# Patient Record
Sex: Female | Born: 1975 | Race: White | Hispanic: No | Marital: Single | Smoking: Former smoker
Health system: Southern US, Community
[De-identification: ages and names within clinical notes are randomized; demographics above are authoritative.]

## PROBLEM LIST (undated history)

## (undated) DIAGNOSIS — F419 Anxiety disorder, unspecified: Secondary | ICD-10-CM

## (undated) DIAGNOSIS — I890 Lymphedema, not elsewhere classified: Secondary | ICD-10-CM

## (undated) HISTORY — DX: Lymphedema, not elsewhere classified: I89.0

## (undated) HISTORY — PX: WRIST SURGERY: SHX841

## (undated) HISTORY — DX: Anxiety disorder, unspecified: F41.9

---

## 1998-07-28 ENCOUNTER — Other Ambulatory Visit: Admission: RE | Admit: 1998-07-28 | Discharge: 1998-07-28 | Payer: Self-pay | Admitting: Gynecology

## 1999-09-10 ENCOUNTER — Other Ambulatory Visit: Admission: RE | Admit: 1999-09-10 | Discharge: 1999-09-10 | Payer: Self-pay | Admitting: Gynecology

## 2000-09-10 ENCOUNTER — Other Ambulatory Visit: Admission: RE | Admit: 2000-09-10 | Discharge: 2000-09-10 | Payer: Self-pay | Admitting: Gynecology

## 2001-02-06 ENCOUNTER — Ambulatory Visit (HOSPITAL_BASED_OUTPATIENT_CLINIC_OR_DEPARTMENT_OTHER): Admission: RE | Admit: 2001-02-06 | Discharge: 2001-02-06 | Payer: Self-pay | Admitting: Orthopedic Surgery

## 2001-09-15 ENCOUNTER — Other Ambulatory Visit: Admission: RE | Admit: 2001-09-15 | Discharge: 2001-09-15 | Payer: Self-pay | Admitting: Gynecology

## 2003-01-31 ENCOUNTER — Other Ambulatory Visit: Admission: RE | Admit: 2003-01-31 | Discharge: 2003-01-31 | Payer: Self-pay | Admitting: Gynecology

## 2003-04-07 ENCOUNTER — Encounter: Admission: RE | Admit: 2003-04-07 | Discharge: 2003-05-26 | Payer: Self-pay | Admitting: Surgery

## 2004-02-20 ENCOUNTER — Other Ambulatory Visit: Admission: RE | Admit: 2004-02-20 | Discharge: 2004-02-20 | Payer: Self-pay | Admitting: Gynecology

## 2005-01-22 ENCOUNTER — Encounter: Admission: RE | Admit: 2005-01-22 | Discharge: 2005-01-22 | Payer: Self-pay | Admitting: Gynecology

## 2005-03-11 ENCOUNTER — Other Ambulatory Visit: Admission: RE | Admit: 2005-03-11 | Discharge: 2005-03-11 | Payer: Self-pay | Admitting: Gynecology

## 2006-04-17 ENCOUNTER — Other Ambulatory Visit: Admission: RE | Admit: 2006-04-17 | Discharge: 2006-04-17 | Payer: Self-pay | Admitting: Gynecology

## 2007-04-28 ENCOUNTER — Other Ambulatory Visit: Admission: RE | Admit: 2007-04-28 | Discharge: 2007-04-28 | Payer: Self-pay | Admitting: Gynecology

## 2008-05-02 ENCOUNTER — Other Ambulatory Visit: Admission: RE | Admit: 2008-05-02 | Discharge: 2008-05-02 | Payer: Self-pay | Admitting: Gynecology

## 2010-12-28 NOTE — Op Note (Signed)
[place]  Patient:    [name], Donna Bell                      MRN: [id number] Proc. Date: 02/06/01 Attending:  Katy Fitch. Naaman Plummer., M.D.                           Operative Report  PREOPERATIVE DIAGNOSIS:  Stenosing tenosynovitis, right first dorsal compartment.  POSTOPERATIVE DIAGNOSIS:  Stenosing tenosynovitis, right first dorsal compartment.  PROCEDURE:  Release of right first dorsal compartment.  SURGEON:  Katy Fitch. Sypher, Montez Hageman., M.D.  ASSISTANT:  Jonni Sanger, P.A.  ANESTHESIA:  0.25% Marcaine and 1% lidocaine field block, supplemented by IV sedation supervised by the anesthesiologist, Kaylyn Layer. Michelle Piper, M.D.  INDICATIONS:  [name] is a 35 year old woman who has had chronic pain on the radial aspect of her right wrist.  This has not responded to splinting, activity modification, and anti-inflammatory medication.  She is brought to the operating room at this time for release of her right first dorsal compartment.  DESCRIPTION OF PROCEDURE:  [name] is brought to the operating room and placed in the supine position upon the operating table.  Following light sedation, the right arm was prepped with Betadine soap and solution and sterilely draped.  Following exsanguination of the limb with the Esmarch bandage, the arterial tourniquet was inflated to 200 mmHg.  The procedure commenced with a short incision directly over the enlarged first dorsal compartment.  Subcutaneous tissues were carefully divided, taking care to identify and gently retract the radial sensory branches.  The compartment had a fibrotic wall.  This was split with scalpel and scissors, revealing two slips of the abductor pollicis longus tendon.  There was a separate compartment for the extensor pollicis brevis noted dorsally.  This was split longitudinally, and the septum between the two compartments was resected.  Thereafter, full range of motion of  the thumb CMC and MP joints was recovered.  The wound was then repaired with intradermal 3-0 Prolene and Steri-Strips.  A compressive dressing was applied with Xeroflo, sterile gauze, and Ace wrap. There were no apparent complications.  For aftercare, [name] will remove her dressing in three days.  She will place a Band-Aid on the wound.  For aftercare, she is given a prescription for Percocet 5 mg one tablet p.o. q.4-6h. p.r.n. pain, 20 tablets without refill. She will return to our office in seven to 10 days for suture removal. DD:  02/06/01 TD:  02/06/01 Job: [id number]

## 2011-10-23 ENCOUNTER — Other Ambulatory Visit: Payer: Self-pay

## 2011-10-23 NOTE — Telephone Encounter (Signed)
Pt called and LM on nurse VM asking if we can please address RF req from pharmacy from today by tonight or tomorrow morn bc she is leaving town. I do not see a req from pharmacy yet. LMOM for pt to CB to tell us what RF she needs.

## 2011-10-23 NOTE — Telephone Encounter (Signed)
Pt called back and clarified that it is her prozac that she needs RFd, asked for 90 day again bc it is a lot cheaper for her. Told pt that we will RF for 90 days but she is actually due for f/up in April. Pt agrees to CB to make appt. Called in RF Prozac 20 QD, #90,NR

## 2011-12-25 ENCOUNTER — Telehealth: Payer: Self-pay

## 2011-12-25 NOTE — Telephone Encounter (Signed)
[facility] [address] requesting refill on pts xanex 0.5mg  Pt sees dr Merla Riches  [facility] [address] ph: [phone]  bf

## 2011-12-27 NOTE — Telephone Encounter (Signed)
Please pull paper chart.  

## 2011-12-28 MED ORDER — ALPRAZOLAM 0.5 MG PO TABS: ORAL_TABLET | ORAL | Status: DC

## 2011-12-28 NOTE — Telephone Encounter (Signed)
What is follow up plan? Patient was to have a CPE in Nov.

## 2011-12-28 NOTE — Telephone Encounter (Signed)
NOTIFIED PT THAT RX IS READY.

## 2011-12-28 NOTE — Telephone Encounter (Signed)
Done and printed

## 2011-12-28 NOTE — Telephone Encounter (Signed)
Spoke with patient, plans to [facility] Monday and schedule CPE.  Advised patient we can rx 1 month supply until she follows up.  Ok?

## 2012-02-13 ENCOUNTER — Other Ambulatory Visit: Payer: Self-pay | Admitting: Physician Assistant

## 2012-02-13 ENCOUNTER — Other Ambulatory Visit: Payer: Self-pay | Admitting: Internal Medicine

## 2012-02-13 MED ORDER — ALPRAZOLAM 0.5 MG PO TABS: ORAL_TABLET | ORAL | Status: DC

## 2012-02-13 NOTE — Telephone Encounter (Signed)
Requests refill auth for Xanax and Fluoxtine. Has appt with Merla Riches end of July. Called pharmacy for refill and they need auth from [place]. Please call patient and let her know if we can authorize.

## 2012-02-13 NOTE — Telephone Encounter (Signed)
Rx's authorized x 1, called patient and VM full.

## 2012-02-14 NOTE — Telephone Encounter (Signed)
PT notified

## 2012-02-14 NOTE — Telephone Encounter (Signed)
Looks like Herbert Seta took care of this

## 2012-02-19 ENCOUNTER — Encounter: Payer: Self-pay | Admitting: Internal Medicine

## 2012-03-11 ENCOUNTER — Ambulatory Visit (INDEPENDENT_AMBULATORY_CARE_PROVIDER_SITE_OTHER): Payer: BC Managed Care – PPO | Admitting: Internal Medicine

## 2012-03-11 ENCOUNTER — Encounter: Payer: Self-pay | Admitting: Internal Medicine

## 2012-03-11 VITALS — BP 102/68 | HR 57 | Temp 98.7°F | Resp 16 | Ht 63.0 in | Wt 125.0 lb

## 2012-03-11 DIAGNOSIS — F429 Obsessive-compulsive disorder, unspecified: Secondary | ICD-10-CM

## 2012-03-11 DIAGNOSIS — F419 Anxiety disorder, unspecified: Secondary | ICD-10-CM | POA: Insufficient documentation

## 2012-03-11 DIAGNOSIS — Z0000 Encounter for general adult medical examination without abnormal findings: Secondary | ICD-10-CM

## 2012-03-11 LAB — CBC WITH DIFFERENTIAL/PLATELET
Basophils Absolute: 0 10*3/uL (ref 0.0–0.1)
Basophils Relative: 1 % (ref 0–1)
HCT: 37.8 % (ref 36.0–46.0)
Hemoglobin: 12.6 g/dL (ref 12.0–15.0)
MCHC: 33.3 g/dL (ref 30.0–36.0)
MCV: 90.9 fL (ref 78.0–100.0)
Monocytes Absolute: 0.3 10*3/uL (ref 0.1–1.0)
Neutro Abs: 2.7 10*3/uL (ref 1.7–7.7)
RBC: 4.16 MIL/uL (ref 3.87–5.11)
RDW: 13.4 % (ref 11.5–15.5)
WBC: 4.2 10*3/uL (ref 4.0–10.5)

## 2012-03-11 LAB — COMPREHENSIVE METABOLIC PANEL
AST: 18 U/L (ref 0–37)
Albumin: 4 g/dL (ref 3.5–5.2)
Alkaline Phosphatase: 33 U/L — ABNORMAL LOW (ref 39–117)
BUN: 15 mg/dL (ref 6–23)
CO2: 25 mEq/L (ref 19–32)
Chloride: 105 mEq/L (ref 96–112)
Creat: 0.82 mg/dL (ref 0.50–1.10)
Glucose, Bld: 89 mg/dL (ref 70–99)
Sodium: 138 mEq/L (ref 135–145)
Total Protein: 6.3 g/dL (ref 6.0–8.3)

## 2012-03-11 LAB — TSH: TSH: 1.661 u[IU]/mL (ref 0.350–4.500)

## 2012-03-11 LAB — POCT URINALYSIS DIPSTICK
Bilirubin, UA: NEGATIVE
Blood, UA: NEGATIVE
Glucose, UA: NEGATIVE
Spec Grav, UA: <=1.005
Urobilinogen, UA: 0.2

## 2012-03-11 LAB — LIPID PANEL
HDL: 80 mg/dL (ref >39)
Total CHOL/HDL Ratio: 2 Ratio

## 2012-03-11 MED ORDER — ALPRAZOLAM 0.5 MG PO TABS: ORAL_TABLET | ORAL | Status: DC

## 2012-03-11 MED ORDER — FLUOXETINE HCL 20 MG PO CAPS: 20.0000 mg | ORAL_CAPSULE | Freq: Every day | ORAL | Status: DC

## 2012-03-12 ENCOUNTER — Encounter: Payer: Self-pay | Admitting: Internal Medicine

## 2012-03-14 ENCOUNTER — Encounter: Payer: Self-pay | Admitting: Internal Medicine

## 2012-03-14 NOTE — Progress Notes (Signed)
Subjective:    Patient ID: Donna Bell, female    DOB: 19-Oct-1975, 36 y.o.   MRN: [id number]  HPIannual exam Patient Active Problem List  Diagnosis  . 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 Long-term relationship going very well Active with lots of friends and enjoy life activities Anxiety controlled well enough occasional Anxiolytic medication and continued prozac  Runner- including half marathon  Review of Systems 13 system review negative    Objective:   Physical Exam Vital signs stable HEENT clear without thyromegaly or lymphadenopathy Heart is regular without murmurs rubs or gallops Lungs are clear Breasts are examined by GYN The abdomen is soft nontender nondistended without organomegaly or masses Extremities no edema and full peripheral pulses Shoulders hips knees and ankles have full range of motion Neurological intact Psychiatric contact    Results for orders placed in visit on 03/11/12  CBC WITH DIFFERENTIAL      Component Value Range   WBC 4.2  4.0 - 10.5 K/uL   RBC 4.16  3.87 - 5.11 MIL/uL   Hemoglobin 12.6  12.0 - 15.0 g/dL   HCT 96.2  95.2 - 84.1 %   MCV 90.9  78.0 - 100.0 fL   MCH 30.3  26.0 - 34.0 pg   MCHC 33.3  30.0 - 36.0 g/dL   RDW 32.4  40.1 - 02.7 %   Platelets 291  150 - 400 K/uL   Neutrophils Relative 65  43 - 77 %   Neutro Abs 2.7  1.7 - 7.7 K/uL   Lymphocytes Relative 26  12 - 46 %   Lymphs Abs 1.1  0.7 - 4.0 K/uL   Monocytes Relative 6  3 - 12 %   Monocytes Absolute 0.3  0.1 - 1.0 K/uL   Eosinophils Relative 2  0 - 5 %   Eosinophils Absolute 0.1  0.0 - 0.7 K/uL   Basophils Relative 1  0 - 1 %   Basophils Absolute 0.0  0.0 - 0.1 K/uL   Smear Review Criteria for review not met    COMPREHENSIVE METABOLIC PANEL      Component Value Range   Sodium 138  135 - 145 mEq/L   Potassium 4.3  3.5 - 5.3 mEq/L   Chloride 105  96 - 112 mEq/L   CO2 25  19 - 32 mEq/L   Glucose, Bld  89  70 - 99 mg/dL   BUN 15  6 - 23 mg/dL   Creat 2.53  6.64 - 4.03 mg/dL   Total Bilirubin 0.5  0.3 - 1.2 mg/dL   Alkaline Phosphatase 33 (*) 39 - 117 U/L   AST 18  0 - 37 U/L   ALT 17  0 - 35 U/L   Total Protein 6.3  6.0 - 8.3 g/dL   Albumin 4.0  3.5 - 5.2 g/dL   Calcium 9.1  8.4 - 47.4 mg/dL  TSH      Component Value Range   TSH 1.661  0.350 - 4.500 uIU/mL  LIPID PANEL      Component Value Range   Cholesterol 161  0 - 200 mg/dL   Triglycerides 76  <259 mg/dL   HDL 80  >56 mg/dL   Total CHOL/HDL Ratio 2.0     VLDL 15  0 - 40 mg/dL   LDL Cholesterol 66  0 - 99 mg/dL  POCT URINALYSIS DIPSTICK      Component Value Range  Color, UA yellow     Clarity, UA clear     Glucose, UA neg     Bilirubin, UA neg     Ketones, UA neg     Spec Grav, UA <=1.005     Blood, UA neg     pH, UA 5.0     Protein, UA neg     Urobilinogen, UA 0.2     Nitrite, UA neg     Leukocytes, UA Negative         Assessment & Plan:  Impression-she is a healthy annual exam and should not repeat lab work until she is 38 Problem #1 anxiety disorder/probable OCD  Meds ordered this encounter  Medications  . Multiple Vitamin (MULTIVITAMIN) tablet    Sig: Take 1 tablet by mouth daily.  Marland Kitchen etonogestrel-ethinyl estradiol (NUVARING) 0.12-0.015 MG/24HR vaginal ring    Sig: Place 1 each vaginally every 28 (twenty-eight) days. Insert vaginally and leave in place for 3 consecutive weeks, then remove for 1 week.  . ALPRAZolam (XANAX) 0.5 MG tablet    Sig: 1/2 to 1 pill daily as needed    Dispense:  30 tablet    Refill:  5  . FLUoxetine (PROZAC) 20 MG capsule    Sig: Take 1 capsule (20 mg total) by mouth daily.    Dispense:  90 capsule    Refill:  3   Followup in 6-12 months depending on medication need

## 2012-10-29 ENCOUNTER — Encounter: Payer: Self-pay | Admitting: *Deleted

## 2012-10-29 DIAGNOSIS — M25569 Pain in unspecified knee: Secondary | ICD-10-CM | POA: Insufficient documentation

## 2013-05-05 ENCOUNTER — Other Ambulatory Visit: Payer: Self-pay | Admitting: Internal Medicine

## 2013-05-16 ENCOUNTER — Ambulatory Visit (INDEPENDENT_AMBULATORY_CARE_PROVIDER_SITE_OTHER): Payer: BC Managed Care – PPO | Admitting: Internal Medicine

## 2013-05-16 VITALS — BP 100/60 | HR 62 | Temp 97.7°F | Resp 18 | Ht 64.0 in | Wt 128.0 lb

## 2013-05-16 DIAGNOSIS — F411 Generalized anxiety disorder: Secondary | ICD-10-CM

## 2013-05-16 DIAGNOSIS — F419 Anxiety disorder, unspecified: Secondary | ICD-10-CM

## 2013-05-16 DIAGNOSIS — F429 Obsessive-compulsive disorder, unspecified: Secondary | ICD-10-CM

## 2013-05-16 MED ORDER — FLUOXETINE HCL 20 MG PO CAPS: 20.0000 mg | ORAL_CAPSULE | Freq: Every day | ORAL | Status: DC

## 2013-05-16 MED ORDER — ALPRAZOLAM 0.5 MG PO TABS: ORAL_TABLET | ORAL | Status: DC

## 2013-05-16 NOTE — Progress Notes (Signed)
This chart was scribed for Donna Sia, MD by Donna Bell, ED Scribe. This patient was seen in room Room 4 and the patient's care was started at 2:45 PM  Subjective:    Patient ID: Donna Bell, female    DOB: 1976/03/02, 37 y.o.   MRN: [id number]  HPI Pt presents to [facility] with medication refill. Pt states she is staying healthy. She received her flu shot last week while at work. Pts last visit was one year ago and her last visit she was healthy. Pt states she has been on 20mg  Prozac for the past year and has been doing well. Pt does not generally take the 0.5mg  Xanax on a daily basis just PRN. Pt expressed concern of doing a yearly blood check. Pt states she has still been running and will be running a half marathon soon. Pt expressed concern of having a heart attack while running a race. She developed this thought while reading recent articles. Pt denies having family that have died before the age of 26.   Pt expressed concern of appropriate time to take Prozac. Pt was once taking it in the morning and felt "buzzed" at work. She switched to taking her medication at night and felt she was unable to sleep. Pt states she has been taking 1mg  of melatonin as a supplement. She states the melatonin has been helping with sleep. Pt is also taking Fish Oil, V-Complex, and Multi-Vitamins.   Pt feels for the last month, while at work and focusing on a certain thing, she feels her head shake. Pt states she is concerned it may be Parkinson's Disease. She denies having anybody observe this activity. Pt states she stops shaking when she moves. She expressed concern if this may be occuring only when she is tired or is it when she is really anxious.   Pt is no longer using Nuva Ring. She is now on low dose estrogen BC.(loloestrin) She states she no longer is having a NMP. She states her breasts still become tender and still gets bloated as if her menstrual cycle was going to come on but never  receives her NMP.   Current Outpatient Prescriptions on File Prior to Visit  Medication Sig Dispense Refill  . ALPRAZolam (XANAX) 0.5 MG tablet 1/2 to 1 pill daily as needed  30 tablet  5  . FLUoxetine (PROZAC) 20 MG capsule Take 1 capsule (20 mg total) by mouth daily.  90 capsule  3  . Multiple Vitamin (MULTIVITAMIN) tablet Take 1 tablet by mouth daily.      Marland Kitchen etonogestrel-ethinyl estradiol (NUVARING) 0.12-0.015 MG/24HR vaginal ring Place 1 each vaginally every 28 (twenty-eight) days. Insert vaginally and leave in place for 3 consecutive weeks, then remove for 1 week.       No current facility-administered medications on file prior to visit.   Past Medical History  Diagnosis Date  . Anxiety   rarely needs xanax Sleeping well melatonin allows fall asl p getting up to urinate  Review of Systems  Constitutional: Negative for activity change, appetite change, fatigue and unexpected weight change.  HENT: Negative for hearing loss.   Eyes: Negative for visual disturbance.  Respiratory: Negative for chest tightness and shortness of breath.   Cardiovascular: Negative for chest pain, palpitations and leg swelling.  Gastrointestinal: Negative for abdominal pain.  Endocrine: Negative for cold intolerance and heat intolerance.  Genitourinary: Negative for difficulty urinating.  Musculoskeletal:       Hx chrond. Pat.-dr graves  Neurological: Negative  for headaches.  Psychiatric/Behavioral: Negative for behavioral problems, sleep disturbance and decreased concentration. The patient is nervous/anxious.        Airplanes        Objective:   Physical Exam  Nursing note and vitals reviewed. Constitutional: She is oriented to person, place, and time. She appears well-developed and well-nourished. No distress.  HENT:  Head: Normocephalic and atraumatic.  Eyes: Conjunctivae and EOM are normal. Pupils are equal, round, and reactive to light.  Neck: Normal range of motion. Neck supple. No  thyromegaly present.  Cardiovascular: Normal rate, regular rhythm and normal heart sounds.   No murmur heard. Pulmonary/Chest: Effort normal and breath sounds normal. No respiratory distress.  Musculoskeletal: Normal range of motion. She exhibits no edema.  Lymphadenopathy:    She has no cervical adenopathy.  Neurological: She is alert and oriented to person, place, and time. She has normal reflexes. No cranial nerve deficit. She exhibits normal muscle tone. Coordination normal.  Skin: Skin is warm and dry.  Psychiatric: She has a normal mood and affect. Her behavior is normal. Judgment and thought content normal.        Assessment & Plan:  Anxiety/well controlled OCD vs GAD Contin meds Meds ordered this encounter  Medications  . ALPRAZolam (XANAX) 0.5 MG tablet----prn    Sig: 1/2 to 1 pill daily as needed    Dispense:  30 tablet    Refill:  5  . FLUoxetine (PROZAC) 20 MG capsule----5pm    Sig: Take 1 capsule (20 mg total) by mouth daily.    Dispense:  90 capsule    Refill:  3     I have reviewed and agree with documentation. Lachele Lievanos P. Merla Riches, M.D.

## 2013-11-27 ENCOUNTER — Other Ambulatory Visit: Payer: Self-pay | Admitting: Internal Medicine

## 2014-07-17 ENCOUNTER — Other Ambulatory Visit: Payer: Self-pay | Admitting: Internal Medicine

## 2014-08-26 ENCOUNTER — Other Ambulatory Visit: Payer: Self-pay | Admitting: Internal Medicine

## 2014-08-31 ENCOUNTER — Ambulatory Visit (INDEPENDENT_AMBULATORY_CARE_PROVIDER_SITE_OTHER): Payer: BC Managed Care – PPO | Admitting: Family Medicine

## 2014-08-31 ENCOUNTER — Encounter: Payer: Self-pay | Admitting: Family Medicine

## 2014-08-31 VITALS — BP 110/62 | HR 58 | Temp 98.1°F | Resp 16 | Ht 63.5 in | Wt 129.4 lb

## 2014-08-31 DIAGNOSIS — K921 Melena: Secondary | ICD-10-CM

## 2014-08-31 DIAGNOSIS — Z1329 Encounter for screening for other suspected endocrine disorder: Secondary | ICD-10-CM

## 2014-08-31 DIAGNOSIS — Z789 Other specified health status: Secondary | ICD-10-CM

## 2014-08-31 DIAGNOSIS — Z13220 Encounter for screening for lipoid disorders: Secondary | ICD-10-CM

## 2014-08-31 DIAGNOSIS — F411 Generalized anxiety disorder: Secondary | ICD-10-CM

## 2014-08-31 DIAGNOSIS — Z113 Encounter for screening for infections with a predominantly sexual mode of transmission: Secondary | ICD-10-CM

## 2014-08-31 DIAGNOSIS — Z0000 Encounter for general adult medical examination without abnormal findings: Secondary | ICD-10-CM

## 2014-08-31 DIAGNOSIS — Z131 Encounter for screening for diabetes mellitus: Secondary | ICD-10-CM

## 2014-08-31 LAB — CBC WITH DIFFERENTIAL/PLATELET
BASOS ABS: 0 10*3/uL (ref 0.0–0.1)
BASOS PCT: 0 % (ref 0–1)
Eosinophils Absolute: 0.1 10*3/uL (ref 0.0–0.7)
Eosinophils Relative: 2 % (ref 0–5)
HCT: 41.8 % (ref 36.0–46.0)
Hemoglobin: 14 g/dL (ref 12.0–15.0)
LYMPHS PCT: 30 % (ref 12–46)
Lymphs Abs: 1.5 10*3/uL (ref 0.7–4.0)
MCH: 31 pg (ref 26.0–34.0)
MCHC: 33.5 g/dL (ref 30.0–36.0)
MCV: 92.7 fL (ref 78.0–100.0)
MONO ABS: 0.3 10*3/uL (ref 0.1–1.0)
MPV: 10.9 fL (ref 8.6–12.4)
Monocytes Relative: 6 % (ref 3–12)
NEUTROS ABS: 3.1 10*3/uL (ref 1.7–7.7)
Neutrophils Relative %: 62 % (ref 43–77)
PLATELETS: 281 10*3/uL (ref 150–400)
RBC: 4.51 MIL/uL (ref 3.87–5.11)
RDW: 13 % (ref 11.5–15.5)
WBC: 5 10*3/uL (ref 4.0–10.5)

## 2014-08-31 LAB — COMPREHENSIVE METABOLIC PANEL
ALBUMIN: 4.1 g/dL (ref 3.5–5.2)
ALK PHOS: 36 U/L — AB (ref 39–117)
ALT: 28 U/L (ref 0–35)
AST: 31 U/L (ref 0–37)
BUN: 17 mg/dL (ref 6–23)
CO2: 28 mEq/L (ref 19–32)
Calcium: 9.6 mg/dL (ref 8.4–10.5)
Chloride: 106 mEq/L (ref 96–112)
Creat: 0.9 mg/dL (ref 0.50–1.10)
Glucose, Bld: 95 mg/dL (ref 70–99)
POTASSIUM: 4.7 meq/L (ref 3.5–5.3)
SODIUM: 141 meq/L (ref 135–145)
TOTAL PROTEIN: 6.8 g/dL (ref 6.0–8.3)
Total Bilirubin: 0.5 mg/dL (ref 0.2–1.2)

## 2014-08-31 LAB — TSH: TSH: 1.764 u[IU]/mL (ref 0.350–4.500)

## 2014-08-31 LAB — LIPID PANEL
CHOL/HDL RATIO: 1.9 ratio
CHOLESTEROL: 147 mg/dL (ref 0–200)
HDL: 78 mg/dL (ref >39)
LDL Cholesterol: 59 mg/dL (ref 0–99)
Triglycerides: 51 mg/dL (ref <150)
VLDL: 10 mg/dL (ref 0–40)

## 2014-08-31 LAB — VITAMIN B12: Vitamin B-12: 518 pg/mL (ref 211–911)

## 2014-08-31 LAB — IRON: IRON: 107 ug/dL (ref 42–145)

## 2014-08-31 MED ORDER — ALPRAZOLAM 0.5 MG PO TABS: ORAL_TABLET | ORAL | Status: DC

## 2014-08-31 MED ORDER — FLUOXETINE HCL 20 MG PO CAPS: 20.0000 mg | ORAL_CAPSULE | Freq: Every day | ORAL | Status: DC

## 2014-08-31 NOTE — Patient Instructions (Signed)

## 2014-08-31 NOTE — Progress Notes (Signed)
Subjective:    Patient ID: Donna Bell, female    DOB: 13-Aug-1975, 39 y.o.   MRN: [id number]  08/31/2014  Annual Exam   HPI This 39 y.o. female presents for Complete Physical Examination.  Last physical:  03/11/2012 Pap smear:  05/2014 [place]; WNL.  OCPs; no menses. Mammogram:  2015 WNL Colonoscopy: never TDAP:  Less than then years.  2007 in paper chart for dog bite.  tDAP Influenza:  05/2014 Eye exam:  05/2014; no glaucoma or cataracts; no glases orc contacts. Dental exam:  Every six months. [place].   Anxiety:  Doing well.  No concerns.  Patient reports good compliance with medication, good tolerance to medication, and good symptom control.    Anal bleeding: has noticed blood on toilet paper in the past year intermittently; there may be blood mixed in the stool but pt is not sure. Discussed with gynecology who provided pt with stool kit to complete at home; pt has not completed yet; gynecology also performed rectal exam.   Eats mostly vegan diet; eats no meat; will eat cheese and eggs.  Followed by Dr. Martinique of dermatology yearly for skin survey. Wears SPF 50 now.  Diagnosed with primary lymphedema of legs at age 39.  S/p evaluation at [facility] by multiple specialist. Wears compression stockings regularly.   Review of Systems  Constitutional: Negative for fever, chills, diaphoresis, activity change, appetite change, fatigue and unexpected weight change.  HENT: Negative for congestion, dental problem, drooling, ear discharge, ear pain, facial swelling, hearing loss, mouth sores, nosebleeds, postnasal drip, rhinorrhea, sinus pressure, sneezing, sore throat, tinnitus, trouble swallowing and voice change.   Eyes: Negative for photophobia, pain, discharge, redness, itching and visual disturbance.  Respiratory: Negative for apnea, cough, choking, chest tightness, shortness of breath, wheezing and stridor.   Cardiovascular: Positive for leg swelling. Negative for chest pain and  palpitations.  Gastrointestinal: Positive for blood in stool and anal bleeding. Negative for nausea, vomiting, abdominal pain, diarrhea, constipation, abdominal distention and rectal pain.  Endocrine: Negative for cold intolerance, heat intolerance, polydipsia, polyphagia and polyuria.  Genitourinary: Negative for dysuria, urgency, frequency, hematuria, flank pain, decreased urine volume, vaginal bleeding, vaginal discharge, enuresis, difficulty urinating, genital sores, vaginal pain, menstrual problem, pelvic pain and dyspareunia.  Musculoskeletal: Negative for myalgias, back pain, joint swelling, arthralgias, gait problem, neck pain and neck stiffness.  Skin: Negative for color change, pallor, rash and wound.  Allergic/Immunologic: Negative for environmental allergies, food allergies and immunocompromised state.  Neurological: Negative for dizziness, tremors, seizures, syncope, facial asymmetry, speech difficulty, weakness, light-headedness, numbness and headaches.  Hematological: Negative for adenopathy. Does not bruise/bleed easily.  Psychiatric/Behavioral: Negative for suicidal ideas, hallucinations, behavioral problems, confusion, sleep disturbance, self-injury, dysphoric mood, decreased concentration and agitation. The patient is not nervous/anxious and is not hyperactive.     Past Medical History  Diagnosis Date  . Anxiety   . Lymphedema of both lower extremities     onset age 49; s/p evaluation [facility]; compression stockings.   Past Surgical History  Procedure Laterality Date  . Wrist surgery     No Known Allergies Current Outpatient Prescriptions  Medication Sig Dispense Refill  . ALPRAZolam (XANAX) 0.5 MG tablet TAKE ONE-HALF TO ONE TABLET BY MOUTH DAILY AS NEEDED 30 tablet 5  . FLUoxetine (PROZAC) 20 MG capsule Take 1 capsule (20 mg total) by mouth daily. 90 capsule 11  . Multiple Vitamin (MULTIVITAMIN) tablet Take 1 tablet by mouth daily.    . LO LOESTRIN FE 1 MG-10 MCG /  10 MCG  tablet      No current facility-administered medications for this visit.   History   Social History  . Marital Status: Single    Spouse Name: N/A    Number of Children: N/A  . Years of Education: N/A   Occupational History  . Not on file.   Social History Main Topics  . Smoking status: Former Research scientist (life sciences)  . Smokeless tobacco: Not on file  . Alcohol Use: Not on file  . Drug Use: Not on file  . Sexual Activity: Not on file   Other Topics Concern  . Not on file   Social History Narrative   Marital status: single; dating seriously x 5 years; happy; no abuse      Children: none; 3 dogs, 3 cats       Lives: with boyfriend, 6 animals     Employment:  School system IT; 14 years.      Tobacco:       Alcohol:      Drugs:      Exercise: 5 days per week; personal trainer; running.  Boot camp classes      Seatbelt: 100%      Guns: none      STDs: none   Family History  Problem Relation Age of Onset  . Diabetes Father   . Mental illness Father     depression  . Cancer Maternal Grandmother   . Cancer Maternal Grandfather   . Cancer Paternal Grandmother        Objective:    BP 110/62 mmHg  Pulse 58  Temp(Src) 98.1 F (36.7 C) (Oral)  Resp 16  Ht 5' 3.5" (1.613 m)  Wt 129 lb 6.4 oz (58.695 kg)  BMI 22.56 kg/m2  SpO2 97% Physical Exam  Constitutional: She is oriented to person, place, and time. She appears well-developed and well-nourished. No distress.  HENT:  Head: Normocephalic and atraumatic.  Right Ear: External ear normal.  Left Ear: External ear normal.  Nose: Nose normal.  Mouth/Throat: Oropharynx is clear and moist.  Eyes: Conjunctivae and EOM are normal. Pupils are equal, round, and reactive to light.  Neck: Normal range of motion and full passive range of motion without pain. Neck supple. No JVD present. Carotid bruit is not present. No thyromegaly present.  Cardiovascular: Normal rate, regular rhythm and normal heart sounds.  Exam reveals no gallop and no  friction rub.   No murmur heard. Pulmonary/Chest: Effort normal and breath sounds normal. She has no wheezes. She has no rales.  Abdominal: Soft. Bowel sounds are normal. She exhibits no distension and no mass. There is no tenderness. There is no rebound and no guarding.  Musculoskeletal: She exhibits edema.       Right shoulder: Normal.       Left shoulder: Normal.       Cervical back: Normal.  +edema B feet; trace edema B legs up to proximal calf B.  Lymphadenopathy:    She has no cervical adenopathy.  Neurological: She is alert and oriented to person, place, and time. She has normal reflexes. No cranial nerve deficit. She exhibits normal muscle tone. Coordination normal.  Skin: Skin is warm and dry. No rash noted. She is not diaphoretic. No erythema. No pallor.  Diffuse sun related skin changes facial, upper chest, back, extremities.  Psychiatric: She has a normal mood and affect. Her behavior is normal. Judgment and thought content normal.  Nursing note and vitals reviewed.  Assessment & Plan:   1. Physical exam, annual   2. Generalized anxiety disorder   3. Screening for STD (sexually transmitted disease)   4. Vegetarian diet   5. Screening for diabetes mellitus   6. Screening, lipid   7. Screening for thyroid disorder   8. Bloody stools      1. Complete Physical Examination: Anticipatory guidance provided---weight maintenance, regular exercise.  Immunizations reviewed and UTD.  Pap smear UTD per gynecology; mammogram UTD per gynecology.   2.  Screening STDs: per CDC guidelines, obtain GC/Chlam, RPR, HIV. 3.  Vegetarian diet: obtain labs including vitamin B12, Vitamin d, and iron levels. 4.  Screening DMII: obtain labs. 5. Screening lipid: obtain labs. 6.  Screening thyroid: obtain labs. 7. Anxiety disorder: controlled; refill of Prozac and Xanax provided; using Xanax twice weekly on average; continue exercise for stress management. 8. Bloody stools: advised pt to  complete stool kit that was provided by gynecology; if pt notices that blood mixed in stool, will warrant colonoscopy; pt expressed understanding.    Meds ordered this encounter  Medications  . LO LOESTRIN FE 1 MG-10 MCG / 10 MCG tablet    Sig:   . FLUoxetine (PROZAC) 20 MG capsule    Sig: Take 1 capsule (20 mg total) by mouth daily.    Dispense:  90 capsule    Refill:  11  . ALPRAZolam (XANAX) 0.5 MG tablet    Sig: TAKE ONE-HALF TO ONE TABLET BY MOUTH DAILY AS NEEDED    Dispense:  30 tablet    Refill:  5    Return in about 1 year (around 09/01/2015) for complete physical examiniation.    Teion Ballin Elayne Guerin, M.D. [facility] [address] [place], [place]  [address] [phone] phone [phone] fax

## 2014-09-01 ENCOUNTER — Encounter: Payer: Self-pay | Admitting: Family Medicine

## 2014-09-01 LAB — RPR

## 2014-09-01 LAB — HEMOGLOBIN A1C
Hgb A1c MFr Bld: 5.1 % (ref <5.7)
Mean Plasma Glucose: 100 mg/dL (ref <117)

## 2014-09-01 LAB — VITAMIN D 25 HYDROXY (VIT D DEFICIENCY, FRACTURES): VIT D 25 HYDROXY: 53 ng/mL (ref 30–100)

## 2014-09-01 LAB — HIV ANTIBODY (ROUTINE TESTING W REFLEX): HIV: NONREACTIVE

## 2014-10-19 ENCOUNTER — Ambulatory Visit (INDEPENDENT_AMBULATORY_CARE_PROVIDER_SITE_OTHER): Payer: BC Managed Care – PPO | Admitting: Sports Medicine

## 2014-10-19 ENCOUNTER — Encounter: Payer: Self-pay | Admitting: Sports Medicine

## 2014-10-19 VITALS — BP 106/74 | Ht 64.0 in | Wt 129.0 lb

## 2014-10-19 DIAGNOSIS — G8929 Other chronic pain: Secondary | ICD-10-CM | POA: Diagnosis not present

## 2014-10-19 DIAGNOSIS — M25511 Pain in right shoulder: Secondary | ICD-10-CM | POA: Insufficient documentation

## 2014-10-19 DIAGNOSIS — M25561 Pain in right knee: Secondary | ICD-10-CM | POA: Diagnosis not present

## 2014-10-19 DIAGNOSIS — M7541 Impingement syndrome of right shoulder: Secondary | ICD-10-CM | POA: Diagnosis not present

## 2014-10-19 DIAGNOSIS — M25569 Pain in unspecified knee: Secondary | ICD-10-CM

## 2014-10-19 NOTE — Progress Notes (Signed)
  Donna GathersGretchen L Bell - 39 y.o. female MRN [id number]  Date of birth: 1976-04-14  SUBJECTIVE:  Including CC & ROS.  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  Previously dx with runner's knee.  Pain worse with running and going up and down stairs.  Has tried Mobic and Celebrex for pain without any relief.  No known trauma or injury.  No popping, clicking, or catching.  No effusion.  Also complains of R shoulder pain for 5 months.  She is point tender over her upper arm.  Pain aggravated by overhead movements.  No loss in strength.  No known trauma or injury.  DATA REVIEWED: No images  PHYSICAL EXAM:  VS: BP:106/74 mmHg  HR: bpm  TEMP: ( )  RESP:   HT:5\' 4"  (162.6 cm)   WT:129 lb (58.514 kg)  BMI:22.2 PHYSICAL EXAM: R knee:  Normal to inspection with no erythema or effusion or obvious bony abnormalities. Palpation normal with no warmth, joint line tenderness, patellar tenderness, or condyle tenderness. ROM full in flexion and extension and lower leg rotation. Ligaments with solid consistent endpoints including ACL, PCL, LCL, MCL. Negative Mcmurray's. Non painful patellar compression. Patellar glide without crepitus. VMO tracks below proximal portion of knee.  Normal quad strength.   Weakened hip adductor strength only on the RT On foot inspection, longitudinal arch drop mild bilaterally, Transverse arch collapse on RT with splaying of toes 1 to 5 and easy to palpate heads of 2nd and 3rd metatarsals.   Poor control with one legged squats  R shoulder: Norm ROM/ Pain with empty can test at 45 degree angle. Gives way easily/ Normal deltoid strength.  Normal supraspinatus strength. R sided scapula tracks laterally with R shoulder abduction.    ASSESSMENT & PLAN: See problem based charting & AVS for pt instructions. 1. Patellofemoral pain, chronic 2/2 poor hip abductor strength and dropped longitudinal arch - hip abductor exercises given and  rotation series with step ups - metatarsal pad inserted to assist with lift of longitudinal arch - okay with continue with normal activity  2. R shoulder pain 2/2 Impingement of supraspinatus - scapula stabilizing exercises given - rotator cuff exercises given  Reassess in 4-6 week  Osborn CohoKenzie Johnston, PGY2 [facility]  Agree with assessment   Sterling BigKB Hiliana Eilts, MD

## 2014-10-19 NOTE — Assessment & Plan Note (Signed)
-   scapula stabilizing exercises given  - rotator cuff exercises given

## 2014-10-19 NOTE — Assessment & Plan Note (Signed)
-   hip abductor exercises given - metatarsal pad inserted to assist with lift of longitudinal arch - okay with continue with normal activity

## 2014-11-24 ENCOUNTER — Telehealth: Payer: Self-pay | Admitting: *Deleted

## 2014-11-24 ENCOUNTER — Ambulatory Visit (INDEPENDENT_AMBULATORY_CARE_PROVIDER_SITE_OTHER): Payer: BC Managed Care – PPO | Admitting: Sports Medicine

## 2014-11-24 ENCOUNTER — Encounter: Payer: Self-pay | Admitting: Sports Medicine

## 2014-11-24 VITALS — BP 104/74 | Ht 64.0 in | Wt 130.0 lb

## 2014-11-24 DIAGNOSIS — M7120 Synovial cyst of popliteal space [Baker], unspecified knee: Secondary | ICD-10-CM | POA: Diagnosis not present

## 2014-11-24 DIAGNOSIS — M25511 Pain in right shoulder: Secondary | ICD-10-CM

## 2014-11-24 DIAGNOSIS — G8929 Other chronic pain: Secondary | ICD-10-CM

## 2014-11-24 DIAGNOSIS — M25552 Pain in left hip: Secondary | ICD-10-CM

## 2014-11-24 DIAGNOSIS — M25569 Pain in unspecified knee: Secondary | ICD-10-CM | POA: Diagnosis not present

## 2014-11-24 MED ORDER — CELECOXIB 200 MG PO CAPS: ORAL_CAPSULE | ORAL | Status: DC

## 2014-11-24 NOTE — Progress Notes (Signed)
Donna Bell - 39 y.o. female MRN [id number]  Date of birth: 26-May-1976  SUBJECTIVE: CC: 1. Right Shoulder Pain 2. Bilateral Knee swelling 3. Left anterior hip pain     HPI:  See problem associated subjectives     ROS:  Denies fevers, chills, night sweats. No recent weight gain or weight loss.  Negative family history of rheumatologic conditions   HISTORY:  Past Medical, Surgical, Social, and Family History reviewed & updated per EMR.  Pertinent Historical Findings include: Social History   Occupational History  . Not on file.   Social History Main Topics  . Smoking status: Former Games developer  . Smokeless tobacco: Not on file  . Alcohol Use: Not on file  . Drug Use: Not on file  . Sexual Activity: Not on file    Multiple injuries including: Chronic Right Shoulder Acute TFL Strain in April 2016 Bilateral Baker's cyst with patellofemoral  Previously worked up for RA and negative Hx of OCD and anxiety Problem  Hip Pain, Acute  Patellofemoral Pain, Chronic   2/2 poor hip abductor strength and dropped longitudinal foot arch   Right Shoulder Pain   2/2 lateral migration and protraction of scapula Course grinding with axial loading and circumduction No known injury    OBJECTIVE:  VS:   HT:5\' 4"  (162.6 cm)   WT:130 lb (58.968 kg)  BMI:22.4          BP:104/74 mmHg  HR: bpm  TEMP: ( )  RESP:   PHYSICAL EXAM: GENERAL: Adult caucasian female. No acute distress PSYCH: Alert and appropriately interactive. SKIN: No open skin lesions or abnormal skin markings on areas inspected as below VASCULAR: Bilateral radial pulses 2+/4.  No significant upper extremity edema.  NEURO: Upper extremity strength is 5+/5 in all myotomes; sensation is intact to light touch in all dermatomes. RIGHT SHOULDER EXAM: overall well aligned, no significant deformity.  Full overhead range of motion.  Rotator cuff strength is 5+/5.  Pain with speeds testing with normal strength.  Pain with axial  loading and grinding.  BILATERAL KNEE EXAM: Overall well aligned.  No significant effusion, possibly 5 mL.  Small Baker's cyst bilaterally.  Range of motion from 0 to 130 bilaterally.  Stable to varus and valgus strain, anterior, posterior drawer.  Lachman's normal.  Negative Thessaly, McMurray's.  Hip abduction strength is 5/5 with TFL predominance LEFT HIP EXAM: overall well line.  No significant swelling.  Marked tenderness palpation over the anterior lateral hip at the insertion of the TFL at the iliac crest.  No significant TTP over glute medius, glute minimus or greater trochanter.  Negative log roll, FADIR, FABER.   DATA OBTAINED:   Limited MSK Ultrasound of Bilateral Kee: Findings: Patella & Patellar Tendon: Normal Quad & Quad Tendon: Normal Suprapatellar Pouch: Normal with physiologic marginal supraphysiologic fluid Medial Joint Line: Normal Lateral Joint Line: Normal Posterior Knee: bilateral small baker's cyst ~1.5X2.5cm  Impression: The above findings are consistent with bilateral incidental baker's cysts. No marked knee pathology       Limited MSK Ultrasound of Left Anterior Hip: Findings: Small hypoechoic change at the insertion of the TFL.  increased neovascularity & positive sonopalpation.  Impression: The above findings are consistent with partial TFL avulsion       ASSESSMENT & PLAN: See problem based charting & AVS for additional documentation Problem List Items Addressed This Visit    Patellofemoral pain, chronic    Problem Based Documentation:    Subjective Report:  Long-standing bilateral knee  pain.  Reports noticing swelling earlier in the week and posterior knees, this is new.   No known specific injury, highly active  Denies fevers, chills, recent weight gain or weight loss.  No nighttime awakenings.  Previously responded well to Celebrex but does not want to be on medications long-term.  Prior rheumatologic workup that was reported as normal.  Is  not been taking any specific medications for this.  Denies any specific foot or hip pain.     Assessment & Plan & Follow up Issues:  Chronic condition  - small Baker's cysts bilaterally. Normal exam without mechanical symptomatology 1. Body Helix bilateral knees compression sleeve provided today. Discussed using during activity as well as for the first 2 hours following activity and PRN.     2. ICE 3. HEP: Hip Abduction & VMO strengthening 4. Long discussion regarding ability to continue exercising. Will limit to any pain and recommending relative rest      Relevant Medications   celecoxib (CELEBREX) 200 MG capsule   Right shoulder pain - Primary    Problem Based Documentation:    Subjective Report:  Persistent right shoulder pain.  No prior injury.  No known radicular component.  Worse with overhead motion and cross body motion.  Occasional clicking.  Difficulty lying on the side at night     Assessment & Plan & Follow up Issues:  Chronic condition  - exam findings suggestive of potential intra-articular/labral pathology. We'll try conservative treatment given other MSK issues at this time. 5. Celebrex 6. Referral to physical therapy > Consider: MSK ultrasound versus MRI arthrogram for further evaluation of intra-articular pathology.         Hip pain, acute    Problem Based Documentation:    Subjective Report:  Left anterior hip pain that is progressively worsened throughout the week.    Begin after a workout.    Pain over the anterior lateral hip.    Does not radiate her groin.    Worse with one legged stand.   Worse with sitting for prolonged periods and then going to standing.       Assessment & Plan & Follow up Issues:  Acute condition  - US shows partial TFL avulsion. Likely secondary to overuse fatigue complicated by acute musculotendinous injury 7. Eccentric exercises shown 8. Hx of Migraines so not a Nitro candidate  9. Short course of Celebrex as  this has helped her MSK issues in the past       Other Visit Diagnoses    Baker's cyst of knee, unspecified laterality           FOLLOW UP:  Return in about 6 weeks (around 01/05/2015).

## 2014-11-24 NOTE — Telephone Encounter (Signed)
Received prior auth for celebrex from [facility].  ZOXW#[id number]#[id number] valid for dates 11/03/14-11/24/15

## 2014-11-24 NOTE — Assessment & Plan Note (Addendum)
Problem Based Documentation:    Subjective Report:  Long-standing bilateral knee pain.  Reports noticing swelling earlier in the week and posterior knees, this is new.   No known specific injury, highly active  Denies fevers, chills, recent weight gain or weight loss.  No nighttime awakenings.  Previously responded well to Celebrex but does not want to be on medications long-term.  Prior rheumatologic workup that was reported as normal.  Is not been taking any specific medications for this.  Denies any specific foot or hip pain.     Assessment & Plan & Follow up Issues:  Chronic condition  - small Baker's cysts bilaterally. Normal exam without mechanical symptomatology 1. Body Helix bilateral knees compression sleeve provided today. Discussed using during activity as well as for the first 2 hours following activity and PRN.     2. ICE 3. HEP: Hip Abduction & VMO strengthening 4. Long discussion regarding ability to continue exercising. Will limit to any pain and recommending relative rest

## 2014-11-24 NOTE — Assessment & Plan Note (Addendum)
Problem Based Documentation:    Subjective Report:  Persistent right shoulder pain.  No prior injury.  No known radicular component.  Worse with overhead motion and cross body motion.  Occasional clicking.  Difficulty lying on the side at night     Assessment & Plan & Follow up Issues:  Chronic condition  - exam findings suggestive of potential intra-articular/labral pathology. We'll try conservative treatment given other MSK issues at this time. 1. Celebrex 2. Referral to physical therapy > Consider: MSK ultrasound versus MRI arthrogram for further evaluation of intra-articular pathology.

## 2014-11-28 DIAGNOSIS — M25559 Pain in unspecified hip: Secondary | ICD-10-CM | POA: Insufficient documentation

## 2014-11-28 NOTE — Assessment & Plan Note (Signed)
Problem Based Documentation:    Subjective Report:  Left anterior hip pain that is progressively worsened throughout the week.    Begin after a workout.    Pain over the anterior lateral hip.    Does not radiate her groin.    Worse with one legged stand.   Worse with sitting for prolonged periods and then going to standing.       Assessment & Plan & Follow up Issues:  Acute condition  - US shows partial TFL avulsion. Likely secondary to overuse fatigue complicated by acute musculotendinous injury 1. Eccentric exercises shown 2. Hx of Migraines so not a Nitro candidate  3. Short course of Celebrex as this has helped her MSK issues in the past

## 2014-11-28 NOTE — Procedures (Signed)
Limited MSK Ultrasound of Left Anterior Hip: Findings: Small hypoechoic change at the insertion of the TFL.  increased neovascularity & positive sonopalpation.  Impression: The above findings are consistent with partial TFL avulsion

## 2014-11-28 NOTE — Procedures (Signed)
Limited MSK Ultrasound of Bilateral Kee: Findings: Patella & Patellar Tendon: Normal Quad & Quad Tendon: Normal Suprapatellar Pouch: Normal with physiologic marginal supraphysiologic fluid Medial Joint Line: Normal Lateral Joint Line: Normal Posterior Knee: bilateral small baker's cyst ~1.5X2.5cm  Impression: The above findings are consistent with bilateral incidental baker's cysts. No marked knee pathology

## 2015-01-24 ENCOUNTER — Encounter: Payer: Self-pay | Admitting: Sports Medicine

## 2015-01-24 ENCOUNTER — Ambulatory Visit (INDEPENDENT_AMBULATORY_CARE_PROVIDER_SITE_OTHER): Payer: BC Managed Care – PPO | Admitting: Sports Medicine

## 2015-01-24 VITALS — BP 105/64 | Ht 63.0 in | Wt 127.0 lb

## 2015-01-24 DIAGNOSIS — M25552 Pain in left hip: Secondary | ICD-10-CM

## 2015-01-24 MED ORDER — NITROGLYCERIN 0.2 MG/HR TD PT24: 0.2000 mg | MEDICATED_PATCH | Freq: Every day | TRANSDERMAL | Status: DC

## 2015-01-24 NOTE — Assessment & Plan Note (Signed)
Persistent left hip pain  Seems to be a TFL insertional tear  Trial on NTG protocol  HEP with hip abduction  Reck p 6 wks and repeat scan

## 2015-01-24 NOTE — Progress Notes (Addendum)
Patient ID: Donna Bell, female   DOB: 1975-12-11, 39 y.o.   MRN: [id number]   Work done on right shoulder with O'Halloran, feeling better  Knees are doing better as well with HEP and PT  Left hip pain laterally Weak on left hip abduction before Has been doing stretches Still has this pain  Running less - once weekly However, ran 5K with no real pain  Sleeping on left is painful  Last visit US showed small avulsion/ tear at insertion of TFL Not on NTG or other Tx  Gen Muscular and NAD Lt hip full ROM Excellent flexion and adduction strength Hip abduction is 50% as strong as on RT hip TTP at upper GT on left  Repeat US This again shows the small tear at the insertion of the TFL into the upper Grt Troch Review of Glut Med and Min tendons do not show tears

## 2015-01-24 NOTE — Patient Instructions (Signed)
Nitroglycerin Protocol   Apply 1/4 nitroglycerin patch to affected area daily.  Change position of patch within the affected area every 24 hours.  You may experience a headache during the first 1-2 weeks of using the patch, these should subside.  If you experience headaches after beginning nitroglycerin patch treatment, you may take your preferred over the counter pain reliever.  Another side effect of the nitroglycerin patch is skin irritation or rash related to patch adhesive.  Please notify our office if you develop more severe headaches or rash, and stop the patch.  Tendon healing with nitroglycerin patch may require 12 to 24 weeks depending on the extent of injury.  Men should not use if taking Viagra, Cialis, or Levitra.   Do not use if you have migraines or rosacea.    See me in 6 weeks

## 2015-03-07 ENCOUNTER — Encounter: Payer: Self-pay | Admitting: Sports Medicine

## 2015-03-07 ENCOUNTER — Ambulatory Visit (INDEPENDENT_AMBULATORY_CARE_PROVIDER_SITE_OTHER): Payer: BC Managed Care – PPO | Admitting: Sports Medicine

## 2015-03-07 VITALS — BP 108/68 | HR 66 | Ht 63.0 in | Wt 127.0 lb

## 2015-03-07 DIAGNOSIS — M25531 Pain in right wrist: Secondary | ICD-10-CM

## 2015-03-07 DIAGNOSIS — M25552 Pain in left hip: Secondary | ICD-10-CM

## 2015-03-07 NOTE — Assessment & Plan Note (Signed)
Try using workout gloves to keep pressure off of volar wrist

## 2015-03-07 NOTE — Assessment & Plan Note (Signed)
Improved with NTG Both on clinical exam and [place] is much better  Encourage to be more cons, with HEP  Cont ntg for 6 to 8 wks  Reck and stop NTG at that visit if healed  OK to do all activities that do not reproduce pain

## 2015-03-07 NOTE — Progress Notes (Signed)
Patient ID: Donna Bell, female   DOB: 08-13-1975, 39 y.o.   MRN: [id number]  3 mos ago found with small TFL tear left hip Not much better on first F/U so 6 wks ago started NTG She says that it is clearly better/ no probs with NTG Less days with pain Pain is reduced more than 50%  Able to do more activity Has done home exercises but misses some days  Pain over RT carpal tunnel area Only activity with pressure is push ups No numbness or night pain   Gen Muscular NAD BP 108/68 mmHg  Pulse 66  Ht  (1.6 m)  Wt 127 lb (57.607 kg)  BMI 22.50 kg/m2    Abduction strength is now up to 75% of RT (was about 50%) Nl hip ROM Other mm groups are strong  TTP in carpal tunnel Aug phalen does not cause numbness  [place] The small tear at insertion of TFL has resolved There is still some mild hypoechoic change at area of injury Glut med tendon looks normal

## 2015-04-25 ENCOUNTER — Ambulatory Visit (INDEPENDENT_AMBULATORY_CARE_PROVIDER_SITE_OTHER): Payer: BC Managed Care – PPO | Admitting: Internal Medicine

## 2015-04-25 ENCOUNTER — Ambulatory Visit: Payer: BC Managed Care – PPO | Admitting: Sports Medicine

## 2015-04-25 VITALS — BP 114/70 | HR 66 | Temp 98.1°F | Resp 18 | Ht 63.25 in | Wt 133.6 lb

## 2015-04-25 DIAGNOSIS — L679 Hair color and hair shaft abnormality, unspecified: Secondary | ICD-10-CM | POA: Diagnosis not present

## 2015-04-25 DIAGNOSIS — R37 Sexual dysfunction, unspecified: Secondary | ICD-10-CM | POA: Diagnosis not present

## 2015-04-25 DIAGNOSIS — R5383 Other fatigue: Secondary | ICD-10-CM

## 2015-04-25 DIAGNOSIS — G478 Other sleep disorders: Secondary | ICD-10-CM | POA: Diagnosis not present

## 2015-04-25 LAB — POCT CBC
Granulocyte percent: 52.2 %G (ref 37–80)
HEMATOCRIT: 41.1 % (ref 37.7–47.9)
HEMOGLOBIN: 12.8 g/dL (ref 12.2–16.2)
LYMPH, POC: 1.7 (ref 0.6–3.4)
MCH, POC: 29 pg (ref 27–31.2)
MCHC: 31.1 g/dL — AB (ref 31.8–35.4)
MCV: 93.2 fL (ref 80–97)
MID (cbc): 0.4 (ref 0–0.9)
MPV: 7.7 fL (ref 0–99.8)
POC Granulocyte: 3.4 (ref 2–6.9)
POC LYMPH %: 31.1 % (ref 10–50)
POC MID %: 6.7 %M (ref 0–12)
Platelet Count, POC: 271 10*3/uL (ref 142–424)
RBC: 4.41 M/uL (ref 4.04–5.48)
RDW, POC: 12.7 %
WBC: 5.4 10*3/uL (ref 4.6–10.2)

## 2015-04-25 LAB — COMPREHENSIVE METABOLIC PANEL
ALBUMIN: 4.1 g/dL (ref 3.6–5.1)
ALK PHOS: 41 U/L (ref 33–115)
ALT: 22 U/L (ref 6–29)
AST: 21 U/L (ref 10–30)
BILIRUBIN TOTAL: 0.4 mg/dL (ref 0.2–1.2)
BUN: 15 mg/dL (ref 7–25)
CO2: 27 mmol/L (ref 20–31)
CREATININE: 0.74 mg/dL (ref 0.50–1.10)
Calcium: 9.2 mg/dL (ref 8.6–10.2)
Chloride: 105 mmol/L (ref 98–110)
Glucose, Bld: 92 mg/dL (ref 65–99)
Potassium: 4.5 mmol/L (ref 3.5–5.3)
SODIUM: 141 mmol/L (ref 135–146)
TOTAL PROTEIN: 6.5 g/dL (ref 6.1–8.1)

## 2015-04-25 LAB — TSH: TSH: 1.924 u[IU]/mL (ref 0.350–4.500)

## 2015-04-25 LAB — T4, FREE: Free T4: 0.96 ng/dL (ref 0.80–1.80)

## 2015-04-25 MED ORDER — ALPRAZOLAM 0.5 MG PO TABS: ORAL_TABLET | ORAL | Status: DC

## 2015-04-25 NOTE — Progress Notes (Addendum)
Subjective:  This chart was scribed for Donna Sia, MD by Stann Ore, Medical Scribe. This patient was seen in Room 1 and the patient's care was started at 2:43 PM.     Patient ID: Donna Bell, female    DOB: 17-Sep-1975, 39 y.o.   MRN: [id number] Chief Complaint  Patient presents with  . Fatigue    Pt. has been extremely tired, wants to have her iron and thyroid functions checked. x1 month   . Medication Refill    Wants a medication review    HPI Donna Bell is a 39 y.o. female who presents to [facility] complaining of feeling extremely fatigue for about a month now.  She wants to have her iron and thyroid checked. She sleeps fairly well, altho occas would wake up due to her dogs but can go back to sleep. When she wakes up, she doesn't feel energized/restored. She feels okay when she's at work. Few weeks ago, she was getting her haircut and Her hairstylist noticed that her hair has become drier.  When she sits down to read after dinner, she feels okay. There are other times during the day when she feels really sleepy. There is no difference noticed in her running. But, she felt too tired to meet up with her physical trainer the last few days.  She's also gained some weight and has always felt cold.   She denies any SOB, chest pain, palpitation. She denies feeling more stressed than usual.   She felt a little anxious a week ago while at work. No history of anxiety disorder since teen years. To calm herself, she left the office and worked out in the field. Then, she went home and took xanax. She slept and felt better. She has been on Prozac for the last several years with good control.  She notes having lost of interest in sex for the past year. She's been in her current relationship for 6 years and it's going great. She continuously takes her estrogen medication daily. Occasional deep thrust dyspareunia. Has vaginal change lubrication. Is non-orgasmic for the last year.  Alcohol or other substances not involved. No nipple discharge headaches or changes in vision. Her current GYN (Dr. Chevis Pretty) is about to retire. No past problems. Will start seeing Dr. Zelphia Cairo. She has been on continuous daily low-dose combination pill for at least 2 years with no withdrawal menses. Mother's menopause uncertain. Has never had children.  Her hip is doing better and healing up well. Dr Darrick Penna. Note CPE 1/16 doing well.  Wt Readings from Last 3 Encounters:  04/25/15 133 lb 9.6 oz (60.601 kg)  03/07/15 127 lb (57.607 kg)  01/24/15 127 lb (57.607 kg)     Patient Active Problem List   Diagnosis Date Noted  . Wrist pain, right 03/07/2015  . Hip pain, acute 11/28/2014  . Patellofemoral pain, chronic 10/19/2014  . Right shoulder pain 10/19/2014  . Knee pain 10/29/2012  . Anxiety     Current outpatient prescriptions:  .  ALPRAZolam (XANAX) 0.5 MG tablet, TAKE ONE-HALF TO ONE TABLET BY MOUTH DAILY AS NEEDED, Disp: 30 tablet, Rfl: 5 .  FLUoxetine (PROZAC) 20 MG capsule, Take 1 capsule (20 mg total) by mouth daily., Disp: 90 capsule, Rfl: 11 .  LO LOESTRIN FE 1 MG-10 MCG / 10 MCG tablet, , Disp: , Rfl:  .  Multiple Vitamin (MULTIVITAMIN) tablet, Take 1 tablet by mouth daily., Disp: , Rfl:  .  nitroGLYCERIN (MINITRAN) 0.2 mg/hr patch,  Place 1 patch (0.2 mg total) onto the skin daily., Disp: 30 patch, Rfl: 1 .  celecoxib (CELEBREX) 200 MG capsule, Take 1 tablet daily X 14 days then prn (Patient not taking: Reported on 04/25/2015), Disp: 30 capsule, Rfl: 1    Review of Systems  Constitutional: Positive for fatigue.  Respiratory: Negative for cough and shortness of breath.   Cardiovascular: Negative for chest pain and palpitations.  Gastrointestinal: Negative for nausea, vomiting, diarrhea and constipation.  Genitourinary: Negative for dysuria and frequency.  Neurological: Negative for dizziness and headaches.  Psychiatric/Behavioral: Negative for sleep disturbance.    Cold/dry hair/swell feet long term hx    Objective:   Physical Exam  Constitutional: She is oriented to person, place, and time. She appears well-developed and well-nourished. No distress.  HENT:  Head: Normocephalic and atraumatic.  Eyes: Conjunctivae and EOM are normal. Pupils are equal, round, and reactive to light.  Neck: Neck supple. No thyromegaly present.  Cardiovascular: Normal rate, regular rhythm, normal heart sounds and intact distal pulses.   No murmur heard. Pulmonary/Chest: Effort normal and breath sounds normal. No respiratory distress. She has no wheezes.  Abdominal: Soft. Bowel sounds are normal. She exhibits no distension and no mass. There is no tenderness. There is no rebound.  Musculoskeletal: Normal range of motion. She exhibits no edema.  Lymphadenopathy:    She has no cervical adenopathy.  Neurological: She is alert and oriented to person, place, and time. No cranial nerve deficit.  Skin: Skin is warm and dry.  Her hair is dry but not coarse and there is no hair loss or brittleness  Psychiatric: She has a normal mood and affect. Her behavior is normal. Judgment and thought content normal.  Nursing note and vitals reviewed.   BP 114/70 mmHg  Pulse 66  Temp(Src) 98.1 F (36.7 C) (Oral)  Resp 18  Ht 5' 3.25" (1.607 m)  Wt 133 lb 9.6 oz (60.601 kg)  BMI 23.47 kg/m2  SpO2 98%  Results for orders placed or performed in visit on 04/25/15  Comprehensive metabolic panel  Result Value Ref Range   Sodium 141 135 - 146 mmol/L   Potassium 4.5 3.5 - 5.3 mmol/L   Chloride 105 98 - 110 mmol/L   CO2 27 20 - 31 mmol/L   Glucose, Bld 92 65 - 99 mg/dL   BUN 15 7 - 25 mg/dL   Creat 4.54 0.98 - 1.19 mg/dL   Total Bilirubin 0.4 0.2 - 1.2 mg/dL   Alkaline Phosphatase 41 33 - 115 U/L   AST 21 10 - 30 U/L   ALT 22 6 - 29 U/L   Total Protein 6.5 6.1 - 8.1 g/dL   Albumin 4.1 3.6 - 5.1 g/dL   Calcium 9.2 8.6 - 14.7 mg/dL  TSH  Result Value Ref Range   TSH 1.924  0.350 - 4.500 uIU/mL  T4, free  Result Value Ref Range   Free T4 0.96 0.80 - 1.80 ng/dL  POCT CBC  Result Value Ref Range   WBC 5.4 4.6 - 10.2 K/uL   Lymph, poc 1.7 0.6 - 3.4   POC LYMPH PERCENT 31.1 10 - 50 %L   MID (cbc) 0.4 0 - 0.9   POC MID % 6.7 0 - 12 %M   POC Granulocyte 3.4 2 - 6.9   Granulocyte percent 52.2 37 - 80 %G   RBC 4.41 4.04 - 5.48 M/uL   Hemoglobin 12.8 12.2 - 16.2 g/dL   HCT, POC 82.9 56.2 -  47.9 %   MCV 93.2 80 - 97 fL   MCH, POC 29.0 27 - 31.2 pg   MCHC 31.1 (A) 31.8 - 35.4 g/dL   RDW, POC 16.1 %   Platelet Count, POC 271 142 - 424 K/uL   MPV 7.7 0 - 99.8 fL        Assessment & Plan:  Fatigue Unrefreshed by sleep Hair changes Sexual dysfunction  --With normal labs we will first discontinue Prozac over the next 3 weeks --She is [facility] --If there is no change after discontinuing Prozac we will discontinue oral contraception is for short while --If develops hair loss [facility] --She is asked to focus on her sleep cycle more specifically and have her partner see if she has apnea or restlessness  I have completed the patient encounter in its entirety as documented by the scribe, with editing by me where necessary. Hermilo Dutter P. Merla Riches, M.D.

## 2015-04-26 ENCOUNTER — Telehealth: Payer: Self-pay | Admitting: Internal Medicine

## 2015-04-26 MED ORDER — FLUOXETINE HCL 10 MG PO CAPS: ORAL_CAPSULE | ORAL | Status: DC

## 2015-04-26 NOTE — Telephone Encounter (Addendum)
Labs wnl so wean prozac--watch for anxiety emerging If no chg in 1-2  mo will d/c ocps

## 2015-05-30 ENCOUNTER — Ambulatory Visit: Payer: BC Managed Care – PPO | Admitting: Sports Medicine

## 2015-07-10 ENCOUNTER — Encounter: Payer: Self-pay | Admitting: Internal Medicine

## 2015-07-18 ENCOUNTER — Other Ambulatory Visit: Payer: Self-pay | Admitting: Internal Medicine

## 2015-07-20 ENCOUNTER — Telehealth: Payer: Self-pay

## 2015-07-20 MED ORDER — FLUOXETINE HCL 10 MG PO CAPS: 10.0000 mg | ORAL_CAPSULE | Freq: Every day | ORAL | Status: DC

## 2015-07-20 NOTE — Telephone Encounter (Signed)
Dr. Merla Richesoolittle,  I spoke with this pt. And she wanted to know if she wanted to get a refill of her Prozac. I see you gave her a rx back and Sept. But have not seen her since. She had said even though she had gotten that rx for the 10mg  she still had some of the 20mg  left, so she finished up taking the 20 and started on the 10mg .   Please advise

## 2015-07-20 NOTE — Telephone Encounter (Signed)
Pt called in stating that her refill for( Prozac 10mg ) was declined and she wants to know the reason why. Please call back with the information please.

## 2015-07-20 NOTE — Telephone Encounter (Signed)
Did she decide not to stop it? i sent in refills

## 2015-10-13 ENCOUNTER — Other Ambulatory Visit: Payer: Self-pay | Admitting: *Deleted

## 2015-10-13 MED ORDER — CELECOXIB 200 MG PO CAPS: ORAL_CAPSULE | ORAL | Status: DC

## 2015-11-15 ENCOUNTER — Other Ambulatory Visit: Payer: Self-pay | Admitting: Internal Medicine

## 2015-11-16 ENCOUNTER — Telehealth: Payer: Self-pay

## 2015-11-16 NOTE — Telephone Encounter (Signed)
Pt would like a 90 day supply of her. It is cheaper for her to get the 90/day supply rather than the 30/day 3 refill from her pharmacy. Please advise at [phone] FLUoxetine (PROZAC) 10 MG capsule [Pharmacy Med Name: FLUOXETINE HCL 10 MG CAPSULE] 30 capsule 3 11/15/2015

## 2015-11-17 NOTE — Telephone Encounter (Signed)
90 days was sent in today.

## 2015-11-17 NOTE — Telephone Encounter (Signed)
Okayed per Deliah BostonMichael Clark.  Needs to return for office visit before running out.

## 2015-11-27 ENCOUNTER — Ambulatory Visit (INDEPENDENT_AMBULATORY_CARE_PROVIDER_SITE_OTHER): Payer: BC Managed Care – PPO | Admitting: Internal Medicine

## 2015-11-27 VITALS — BP 104/62 | HR 80 | Temp 98.8°F | Resp 15 | Ht 63.25 in | Wt 132.8 lb

## 2015-11-27 DIAGNOSIS — J0100 Acute maxillary sinusitis, unspecified: Secondary | ICD-10-CM

## 2015-11-27 DIAGNOSIS — J029 Acute pharyngitis, unspecified: Secondary | ICD-10-CM | POA: Diagnosis not present

## 2015-11-27 DIAGNOSIS — F419 Anxiety disorder, unspecified: Secondary | ICD-10-CM | POA: Diagnosis not present

## 2015-11-27 DIAGNOSIS — R37 Sexual dysfunction, unspecified: Secondary | ICD-10-CM | POA: Diagnosis not present

## 2015-11-27 MED ORDER — ALPRAZOLAM 0.5 MG PO TABS: ORAL_TABLET | ORAL | Status: DC

## 2015-11-27 MED ORDER — LIDOCAINE VISCOUS 2 % MT SOLN: OROMUCOSAL | Status: DC

## 2015-11-27 MED ORDER — AZITHROMYCIN 250 MG PO TABS: ORAL_TABLET | ORAL | Status: DC

## 2015-11-27 MED ORDER — ETONOGESTREL-ETHINYL ESTRADIOL 0.12-0.015 MG/24HR VA RING: VAGINAL_RING | VAGINAL | Status: DC

## 2015-11-27 MED ORDER — FLUOXETINE HCL 10 MG PO CAPS: ORAL_CAPSULE | ORAL | Status: DC

## 2015-11-27 NOTE — Patient Instructions (Signed)
     IF you received an x-ray today, you will receive an invoice from [facility]. Please contact [facility] at [phone] with questions or concerns regarding your invoice.   IF you received labwork today, you will receive an invoice from [facility]. Please contact [facility] at [phone] with questions or concerns regarding your invoice.   Our billing staff will not be able to assist you with questions regarding bills from these companies.  You will be contacted with the lab results as soon as they are available. The fastest way to get your results is to activate your My Chart account. Instructions are located on the last page of this paperwork. If you have not heard from us regarding the results in 2 weeks, please contact this office.      

## 2015-11-27 NOTE — Progress Notes (Signed)
Subjective:  By signing my name below, I, Raven Small, attest that this documentation has been prepared under the direction and in the presence of [name], MD.  Electronically Signed: Andrew Au, ED Scribe. 11/27/2015. 10:27 AM.   Patient ID: Donna Bell, female    DOB: 1975/09/05, 40 y.o.   MRN: [id number]  HPI   Chief Complaint  Patient presents with  . Sore Throat    x 6 days   . Nasal Congestion    pressure in head, concern with sinus infection. [place], started on Amoxicillin, but broke out in a rash     HPI Comments: Donna Bell is a 40 y.o. female who presents to the [facility] complaining of sore throat that began 6 days ago. Pt states she was seen at the. She reports associated HA, body aches, cough, nasal congestion with thick yellow mucous. She was taking alka seltzer cold but did not find relief with this. She was seen while at the beach last week and was treated with amoxicillin but broke out in a non-painful, non-itchy erythematous rash along her trunk. She has been taking OTC sinus congestion and pain relief medication without relief. She denies fever, night sweats and chills.   Pt would like a medication refill. She takes xanax as needed to help with sleep but finds herself using medication. She is tolerating prozac  well.  Pt takes celebrex as needed when she has a race.  Nitroglycerin patch worked well for her.   Patient Active Problem List   Diagnosis Date Noted  . Wrist pain, right 03/07/2015  . Hip pain, acute 11/28/2014  . Patellofemoral pain, chronic 10/19/2014  . Right shoulder pain 10/19/2014  . Knee pain 10/29/2012  . Anxiety    Past Medical History  Diagnosis Date  . Anxiety   . Lymphedema of both lower extremities     onset age 2; s/p evaluation [facility]; compression stockings.   Past Surgical History  Procedure Laterality Date  . Wrist surgery     Allergies  Allergen  Reactions  . Amoxicillin Rash   Prior to Admission medications   Medication Sig Start Date End Date Taking? Authorizing Provider  ALPRAZolam Prudy Feeler) 0.5 MG tablet TAKE ONE-HALF TO ONE TABLET BY MOUTH DAILY AS NEEDED 04/25/15  Yes Tonye Pearson, MD  celecoxib (CELEBREX) 200 MG capsule Take 1 tablet daily X 14 days then prn 10/13/15  Yes Enid Baas, MD  FLUoxetine (PROZAC) 10 MG capsule TAKE 1 CAPSULE (10 MG TOTAL) BY MOUTH DAILY. 11/17/15  Yes Ofilia Neas, PA-C  LO LOESTRIN FE 1 MG-10 MCG / 10 MCG tablet  08/07/14  Yes Historical Provider, MD  Multiple Vitamin (MULTIVITAMIN) tablet Take 1 tablet by mouth daily.   Yes Historical Provider, MD  nitroGLYCERIN (MINITRAN) 0.2 mg/hr patch Place 1 patch (0.2 mg total) onto the skin daily. 01/24/15  Yes Enid Baas, MD   Social History   Social History  . Marital Status: Single    Spouse Name: N/A  . Number of Children: N/A  . Years of Education: N/A   Occupational History  . Not on file.   Social History Main Topics  . Smoking status: Former Games developer  . Smokeless tobacco: Not on file  . Alcohol Use: Not on file  . Drug Use: Not on file  . Sexual Activity: Not on file   Other Topics Concern  . Not on file  Social History Narrative   Marital status: single; dating seriously x 5 years; happy; no abuse      Children: none; 3 dogs, 3 cats       Lives: with boyfriend, 6 animals     Employment:  School system IT; 14 years.      Tobacco:       Alcohol:      Drugs:      Exercise: 5 days per week; personal trainer; running.  Boot camp classes      Seatbelt: 100%      Guns: none      STDs: none   Review of Systems  Constitutional: Negative for fever, chills and diaphoresis.  HENT: Positive for congestion, postnasal drip, sinus pressure and sore throat.   Respiratory: Positive for cough.   Genitourinary:       See 9/16 OV--gyn f/u was not helpful ?ocps the cause of reduced libido Remembers she did well with nuvaring    Musculoskeletal: Positive for myalgias.  Neurological: Positive for headaches.   Objective:  Physical Exam  Constitutional: She is oriented to person, place, and time. She appears well-developed and well-nourished. No distress.  HENT:  Head: Normocephalic and atraumatic.  Mouth/Throat: Posterior oropharyngeal erythema present. No oropharyngeal exudate.  Purulent discharge in nose.  Eyes: Conjunctivae and EOM are normal.  Neck: Neck supple.  Small tender cervical nodes  Cardiovascular: Normal rate, regular rhythm and normal heart sounds.   No murmur heard. Pulmonary/Chest: Effort normal. She has no wheezes. She has no rales.  Musculoskeletal: Normal range of motion.  Neurological: She is alert and oriented to person, place, and time.  Skin: Skin is warm and dry.  Psychiatric: She has a normal mood and affect. Her behavior is normal.  Nursing note and vitals reviewed.  Filed Vitals:   11/27/15 0953  BP: 104/62  Pulse: 80  Temp: 98.8 F (37.1 C)  TempSrc: Oral  Resp: 15  Height: 5' 3.25" (1.607 m)  Weight: 132 lb 12.8 oz (60.238 kg)  SpO2: 99%   Assessment & Plan:   1 Acute maxillary sinusitis, recurrence not specified   ----chg to zith 2 Sore throat ---TC sent  3 Anxiety ---refill proz and xanax---only has to use xanax occas to sleep now  4 Sexual dysfunction --try chg to nuva ring///read [employer]        Orders Placed This Encounter  Procedures  . Culture, Group A Strep    Order Specific Question:  Source    Answer:  throat    Meds ordered this encounter  Medications  . FLUoxetine (PROZAC) 10 MG capsule    Sig: TAKE 1 CAPSULE (10 MG TOTAL) BY MOUTH DAILY.    Dispense:  90 capsule    Refill:  3  . ALPRAZolam (XANAX) 0.5 MG tablet    Sig: TAKE ONE-HALF TO ONE TABLET BY MOUTH DAILY AS NEEDED    Dispense:  30 tablet    Refill:  5  . azithromycin (ZITHROMAX) 250 MG tablet    Sig: As packaged    Dispense:  6 tablet    Refill:  0  . lidocaine  (XYLOCAINE) 2 % solution    Sig: Use 1 teaspoon every 2 hours to swish and swallow or spit as needed for pain    Dispense:  60 mL    Refill:  0  . etonogestrel-ethinyl estradiol (NUVARING) 0.12-0.015 MG/24HR vaginal ring    Sig: Insert vaginally and leave in place for 3 consecutive weeks, then  remove for 1 week.    Dispense:  1 each    Refill:  12  call if no resp to zith Consider f/u with Mannie Stabile PA-C in 3-6 mo I have completed the patient encounter in its entirety as documented by the scribe, with editing by me where necessary. Rhiannon Sassaman P. Merla Riches, M.D.

## 2015-11-28 LAB — CULTURE, GROUP A STREP: ORGANISM ID, BACTERIA: NORMAL

## 2016-02-11 ENCOUNTER — Other Ambulatory Visit: Payer: Self-pay | Admitting: Physician Assistant

## 2016-02-14 ENCOUNTER — Telehealth: Payer: Self-pay

## 2016-02-14 ENCOUNTER — Other Ambulatory Visit: Payer: Self-pay | Admitting: Physician Assistant

## 2016-02-14 NOTE — Telephone Encounter (Signed)
Left message for pt to call back  °

## 2016-02-14 NOTE — Telephone Encounter (Signed)
Pt is needing to talk with someone about her prozac rx not being approved and she will be out in 3 days and going out of town this weekend  Best number [phone]

## 2016-02-16 NOTE — Telephone Encounter (Signed)
This is taken care of, the pharmacy has her refills.

## 2016-12-11 NOTE — Progress Notes (Signed)
[facility] [address] [place], [place] [address] Phone: [phone] Subjective:    I'm seeing this patient by the request  of:    CC: Left elbow pain right knee pain   BJY:[id number]  Donna Bell is a 41 y.o. female coming in with complaint of left elbow pain. Patient has had this for quite some time. Discuss as a dull, throbbing aching pain. States it's worse with certain lifting mechanics that she has not finger out yet. Patient continues to try to work on a regular basis but finds it difficult secondary to the discomfort. Patient denies any numbness. Patient does normal range her injury but continues to work out regularly. Rates the severity of pain a 6 out of 10 and sometimes can have a throbbing aching pain at night.   also right knee pain. Patient has pain on the anterior aspect of the knee. Seems to be worse with running or jumping. Symptoms going down stairs. Sometimes feels like she has swelling. Has been diagnosed with a Baker cyst previously. Rates severity pain as 5 out of 10. Denies any locking or giving out on her. Better with rest.  Past Medical History:  Diagnosis Date  . Anxiety   . Lymphedema of both lower extremities    onset age 61; s/p evaluation [facility]; compression stockings.   Past Surgical History:  Procedure Laterality Date  . WRIST SURGERY     Social History   Social History  . Marital status: Single    Spouse name: N/A  . Number of children: N/A  . Years of education: N/A   Social History Main Topics  . Smoking status: Former Games developer  . Smokeless tobacco: Not on file  . Alcohol use Not on file  . Drug use: Unknown  . Sexual activity: Not on file   Other Topics Concern  . Not on file   Social History Narrative   Marital status: single; dating seriously x 5 years; happy; no abuse      Children: none; 3 dogs, 3 cats       Lives: with boyfriend, 6 animals     Employment:  School system IT; 14 years.   Tobacco:       Alcohol:      Drugs:      Exercise: 5 days per week; personal trainer; running.  Boot camp classes      Seatbelt: 100%      Guns: none      STDs: none   Allergies  Allergen Reactions  . Amoxicillin Rash   Family History  Problem Relation Age of Onset  . Diabetes Father   . Mental illness Father     depression  . Cancer Maternal Grandmother   . Cancer Maternal Grandfather   . Cancer Paternal Grandmother     Past medical history, social, surgical and family history all reviewed in electronic medical record.  No pertanent information unless stated regarding to the chief complaint.   Review of Systems:Review of systems updated and as accurate as of 12/12/16  No headache, visual changes, nausea, vomiting, diarrhea, constipation, dizziness, abdominal pain, skin rash, fevers, chills, night sweats, weight loss, swollen lymph nodes, body aches, joint swelling, muscle aches, chest pain, shortness of breath, mood changes.   Objective  Blood pressure 110/66, pulse 76, resp. rate 16, weight 130 lb 4 oz (59.1 kg), SpO2 98 %. Systems examined below as of 12/12/16   General: No apparent distress alert and oriented x3  mood and affect normal, dressed appropriately.  HEENT: Pupils equal, extraocular movements intact  Respiratory: Patient's speak in full sentences and does not appear short of breath  Cardiovascular: No lower extremity edema, non tender, no erythema  Skin: Warm dry intact with no signs of infection or rash on extremities or on axial skeleton.  Abdomen: Soft nontender  Neuro: Cranial nerves II through XII are intact, neurovascularly intact in all extremities with 2+ DTRs and 2+ pulses.  Lymph: No lymphadenopathy of posterior or anterior cervical chain or axillae bilaterally.  Gait normal with good balance and coordination.  MSK:  Non tender with full range of motion and good stability and symmetric strength and tone of shoulders,  wrist, hip and ankles bilaterally.    Elbow: Left Unremarkable to inspection. Range of motion full pronation, supination, flexion, extension. 4-5 strength compared to  contralateral side Stable to varus, valgus stress. Negative moving valgus stress test. Tender to palpation in the lateral epicondylar region Ulnar nerve does not sublux. Negative cubital tunnel Tinel's. Contralateral elbow unremarkable  Musculoskeletal ultrasound was performed and interpreted by Terrilee Files D.O.   Elbow:  Lateral epicondylar region does have some very mild hypoechoic changes but no true tear. No true avulsion fracture noted.  IMPRESSION:  Lateral epicondylitis  Procedure note 97110; 15 minutes spent for Therapeutic exercises as stated in above notes.  This included exercises focusing on stretching, strengthening, with significant focus on eccentric aspects.   Patellofemoral Syndrome  Reviewed anatomy using anatomical model and how PFS occurs.  Given rehab exercises handout for VMO, hip abductors, core, entire kinetic chain including proprioception exercises including cone touches, step downs, hip elevations and turn outs.  Could benefit from PT, regular exercise, upright biking, and a PFS knee brace to assist with tracking abnormalities. Proper technique shown and discussed handout in great detail with ATC.  All questions were discussed and answered.      Impression and Recommendations:     This case required medical decision making of moderate complexity.      Note: This dictation was prepared with Dragon dictation along with smaller phrase technology. Any transcriptional errors that result from this process are unintentional.

## 2016-12-12 ENCOUNTER — Ambulatory Visit (INDEPENDENT_AMBULATORY_CARE_PROVIDER_SITE_OTHER): Payer: BC Managed Care – PPO | Admitting: Family Medicine

## 2016-12-12 ENCOUNTER — Ambulatory Visit: Payer: Self-pay

## 2016-12-12 VITALS — BP 110/66 | HR 76 | Resp 16 | Wt 130.2 lb

## 2016-12-12 DIAGNOSIS — M7712 Lateral epicondylitis, left elbow: Secondary | ICD-10-CM

## 2016-12-12 DIAGNOSIS — M25569 Pain in unspecified knee: Secondary | ICD-10-CM | POA: Diagnosis not present

## 2016-12-12 DIAGNOSIS — M25522 Pain in left elbow: Secondary | ICD-10-CM | POA: Diagnosis not present

## 2016-12-12 DIAGNOSIS — G8929 Other chronic pain: Secondary | ICD-10-CM | POA: Diagnosis not present

## 2016-12-12 MED ORDER — DICLOFENAC SODIUM 2 % TD SOLN: 2.0000 g | Freq: Two times a day (BID) | TRANSDERMAL | 3 refills | Status: DC

## 2016-12-12 NOTE — Assessment & Plan Note (Signed)
Patient will do home exercises, icing regimen, patient given a wrist sprain is to avoid wrist extension. We discussed ergonomics throughout work. Patient will start make any changes and come back and see me again in 4-6 weeks.

## 2016-12-12 NOTE — Assessment & Plan Note (Signed)
Patient given home exercises and will do topical anti-inflammatories. Patient is going to potentially even will avoid certain activities. We discussed the importance of vastus medialis obliques strengthening. Work with Event organiserathletic trainer to learn home exercises in greater detail.

## 2016-12-12 NOTE — Patient Instructions (Signed)
Good to see you.   Ice 20 minutes 2 times daily. Usually after activity and before bed. Wear brace day and night for 1 week then nightly for 2 weeks Only lift with thumbs up or underhand.  pennsaid pinkie amount topically 2 times daily as needed.  Exercises 3 times a week.  For the knee more bike and elliptical  Would lay off running or jumping for now.  pennsaid pinkie amount topically 2 times daily as needed.  Vitamin D 2000 IU daily  Turmeric 500mg  twice daily  You will do great  See me again in 4 weeks.

## 2016-12-12 NOTE — Progress Notes (Signed)
Pre-visit discussion using our clinic review tool. No additional management support is needed unless otherwise documented below in the visit note.  

## 2016-12-16 ENCOUNTER — Telehealth: Payer: Self-pay | Admitting: General Practice

## 2016-12-16 ENCOUNTER — Telehealth: Payer: Self-pay | Admitting: Family Medicine

## 2016-12-16 ENCOUNTER — Other Ambulatory Visit: Payer: Self-pay

## 2016-12-16 DIAGNOSIS — M25522 Pain in left elbow: Secondary | ICD-10-CM

## 2016-12-16 MED ORDER — DICLOFENAC SODIUM 2 % TD SOLN: 2.0000 g | Freq: Two times a day (BID) | TRANSDERMAL | 3 refills | Status: DC

## 2016-12-16 NOTE — Telephone Encounter (Signed)
States accidentally denied pennsaid script.  Needs script to be sent over again.

## 2016-12-16 NOTE — Telephone Encounter (Signed)
Sent patient MyChart message with pharmacy phone number. Medication is not called in locally but to a pharmacy in [place].

## 2016-12-16 NOTE — Telephone Encounter (Signed)
Pt called in and said that that she never rec'd the pensaid.  She said that she called the pharmacy and they do not have it.  Can this be resent?

## 2016-12-17 ENCOUNTER — Telehealth: Payer: Self-pay | Admitting: General Practice

## 2016-12-17 ENCOUNTER — Other Ambulatory Visit: Payer: Self-pay

## 2016-12-17 DIAGNOSIS — M25522 Pain in left elbow: Secondary | ICD-10-CM

## 2016-12-17 MED ORDER — DICLOFENAC SODIUM 2 % TD SOLN: 2.0000 g | Freq: Two times a day (BID) | TRANSDERMAL | 3 refills | Status: DC

## 2016-12-17 NOTE — Telephone Encounter (Signed)
Please call pt back, has questions about appt 5/3

## 2016-12-17 NOTE — Telephone Encounter (Signed)
Patient called in as she was considering making a work comp claim for her elbow injury. Was curious if we filed work comp. Let her know that we do not and that she would have to see her primary care doctor or the doc that does work comp for her work in order to get paperwork filled out. Patient voices understanding.

## 2017-01-09 ENCOUNTER — Ambulatory Visit: Payer: BC Managed Care – PPO | Admitting: Family Medicine

## 2017-02-27 ENCOUNTER — Telehealth: Payer: Self-pay | Admitting: Physician Assistant

## 2017-02-27 NOTE — Telephone Encounter (Signed)
PATIENT IS REQUESTING A REFILL ON HER FLUOXETINE HCI 10 MG. SHE SAID SHE IS COMPLETELY OUT AND CAN NOT JUST STOP TAKING IT. HER PHARMACIST TOLD HER THEY FAXED OVER A REFILL REQUEST ON WED. (02/26/17). (I EXPLAINED OUR REFILL POLICY). BEST PHONE [phone] (CELL) PHARMACY CHOICE IS [facility] [address].

## 2017-02-27 NOTE — Telephone Encounter (Signed)
Pt advised she will need to schedule an office visit before refills. Last seen in the clinic 1 year ago Transferred to schedule line

## 2017-02-28 ENCOUNTER — Ambulatory Visit (INDEPENDENT_AMBULATORY_CARE_PROVIDER_SITE_OTHER): Payer: BC Managed Care – PPO | Admitting: Physician Assistant

## 2017-02-28 ENCOUNTER — Encounter: Payer: Self-pay | Admitting: Physician Assistant

## 2017-02-28 VITALS — BP 107/67 | HR 64 | Temp 98.1°F | Resp 18 | Ht 63.25 in | Wt 131.6 lb

## 2017-02-28 DIAGNOSIS — F411 Generalized anxiety disorder: Secondary | ICD-10-CM | POA: Diagnosis not present

## 2017-02-28 MED ORDER — ALPRAZOLAM 0.5 MG PO TABS: ORAL_TABLET | ORAL | 1 refills | Status: AC

## 2017-02-28 MED ORDER — FLUOXETINE HCL 10 MG PO CAPS: 10.0000 mg | ORAL_CAPSULE | Freq: Every day | ORAL | 0 refills | Status: DC

## 2017-02-28 NOTE — Patient Instructions (Signed)
     IF you received an x-ray today, you will receive an invoice from [facility]. Please contact [facility] at [phone] with questions or concerns regarding your invoice.   IF you received labwork today, you will receive an invoice from [facility]. Please contact [facility] at [phone] with questions or concerns regarding your invoice.   Our billing staff will not be able to assist you with questions regarding bills from these companies.  You will be contacted with the lab results as soon as they are available. The fastest way to get your results is to activate your My Chart account. Instructions are located on the last page of this paperwork. If you have not heard from us regarding the results in 2 weeks, please contact this office.     

## 2017-02-28 NOTE — Progress Notes (Signed)
02/28/2017 10:03 AM   DOB: [birth year] / MRN: [id number]  SUBJECTIVE:  Donna GathersGretchen L Bell is a 41 y.o. female presenting for medication refills.  Tells me Prozac has helped with her anxiety and obsessive tendencies (stove checking and door checking). She tells me that she has been told that she has GAD.  She feels well overall and has not been on her prozac now for about 1 weeks.  Notes no change in her symptoms.   Tells me that she does not take xanax daily and sometimes not weekly.    Depression screen PHQ 2/9 11/27/2015  Decreased Interest 0  Down, Depressed, Hopeless 0  PHQ - 2 Score 0     She is allergic to amoxicillin.   She  has a past medical history of Anxiety and Lymphedema of both lower extremities.    She  reports that she has quit smoking. She has never used smokeless tobacco. She  has no sexual activity history on file. The patient  has a past surgical history that includes Wrist surgery.  Her family history includes Cancer in her maternal grandfather, maternal grandmother, and paternal grandmother; Diabetes in her father; Mental illness in her father.  Review of Systems  Psychiatric/Behavioral: Negative for depression, hallucinations, memory loss, substance abuse and suicidal ideas. The patient is nervous/anxious. The patient does not have insomnia.     The problem list and medications were reviewed and updated by myself where necessary and exist elsewhere in the encounter.   OBJECTIVE:  BP 107/67   Pulse 64   Temp 98.1 F (36.7 C) (Oral)   Resp 18   Ht 5' 3.25" (1.607 m)   Wt 131 lb 9.6 oz (59.7 kg)   LMP 02/11/2017   SpO2 97%   BMI 23.13 kg/m   Physical Exam  Constitutional: She is oriented to person, place, and time. She is active.  Non-toxic appearance.  Eyes: Pupils are equal, round, and reactive to light. EOM are normal.  Cardiovascular: Normal rate, regular rhythm, S1 normal, S2 normal, normal heart sounds and intact distal pulses.  Exam reveals no  gallop, no friction rub and no decreased pulses.   No murmur heard. Pulmonary/Chest: Effort normal. No stridor. No tachypnea. No respiratory distress. She has no wheezes. She has no rales.  Abdominal: She exhibits no distension.  Musculoskeletal: She exhibits no edema.  Neurological: She is alert and oriented to person, place, and time. She has normal strength and normal reflexes. She is not disoriented. She displays no atrophy. No cranial nerve deficit or sensory deficit. She exhibits normal muscle tone. Coordination and gait normal.  Skin: Skin is warm and dry. She is not diaphoretic. No pallor.  Psychiatric: Her behavior is normal.    No results found for this or any previous visit (from the past 72 hour(s)).  No results found.  ASSESSMENT AND PLAN:  [name] was seen today for medication refill.  Diagnoses and all orders for this visit:  GAD (generalized anxiety disorder): Controlled. Will continue current plan.  Using xanax responsibly per HPI.  I am happy to carry this for her.   -     FLUoxetine (PROZAC) 10 MG capsule; Take 1 capsule (10 mg total) by mouth daily. -     ALPRAZolam (XANAX) 0.5 MG tablet; TAKE ONE-HALF TO ONE TABLET BY MOUTH DAILY AS NEEDED    The patient is advised to call or return to clinic if she does not see an improvement in symptoms, or to seek the  care of the closest emergency department if she worsens with the above plan.   Deliah Boston, MHS, PA-C [facility] at [facility] 02/28/2017 10:03 AM

## 2017-07-24 ENCOUNTER — Ambulatory Visit: Payer: BC Managed Care – PPO | Admitting: Sports Medicine

## 2017-07-24 ENCOUNTER — Encounter: Payer: Self-pay | Admitting: Sports Medicine

## 2017-07-24 DIAGNOSIS — G2589 Other specified extrapyramidal and movement disorders: Secondary | ICD-10-CM

## 2017-07-24 DIAGNOSIS — M2241 Chondromalacia patellae, right knee: Secondary | ICD-10-CM | POA: Diagnosis not present

## 2017-07-24 MED ORDER — GABAPENTIN 300 MG PO CAPS: 300.0000 mg | ORAL_CAPSULE | Freq: Every day | ORAL | 1 refills | Status: DC

## 2017-07-24 NOTE — Assessment & Plan Note (Signed)
Patient is showing some evidence of scapular dyskinesia.  This may be the cause of her left lateral epicondylar pain as it was apparent that her discomfort did not have any significant point tenderness at the lateral epicondyle and involved the upper arm and scapula as well.  Patient may have some nerve impingement/irritation from this dyskinesia.  We will continue to follow. -Initiate scapular stabilization exercises -Continue lateral epicondylar home exercises -Initiate gabapentin 300 mg q. Nightly -Follow-up 4 weeks

## 2017-07-24 NOTE — Progress Notes (Deleted)
   HPI  CC: ***   Medications/Interventions Tried: ***  See HPI and/or previous note for associated ROS.  Objective: Ht 5' 3.5" (1.613 m)   Wt 131 lb (59.4 kg)   BMI 22.84 kg/m  Gen: ***-Hand Dominant. NAD, well groomed, a/o x3, normal affect.  CV: Well-perfused. Warm.  Resp: Non-labored.  Neuro: Sensation intact throughout. No*** gross coordination deficits.  Gait: ***Nonpathologic posture, ***unremarkable stride without signs of limp or balance issues. ***  Assessment and plan:  No problem-specific Assessment & Plan notes found for this encounter.   No orders of the defined types were placed in this encounter.   No orders of the defined types were placed in this encounter.    Kathee DeltonIan D Fain Francis, MD,MS [facility] 07/24/2017 10:07 AM

## 2017-07-24 NOTE — Assessment & Plan Note (Signed)
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  Obvious crepitus on exam.  No evidence of instability.  Patient likely developed this with high weight and deep squat exercises. -Discussed avoidance of full deep knee bends -Initiate single leg exercises -Ice, heat as needed -Rest and compress as tolerated -Encouraged patellar knee strap if this provides any help.

## 2017-07-24 NOTE — Progress Notes (Signed)
HPI  CC: Left elbow pain and right knee pain Patient is here to discuss her left-sided elbow and right-sided knee pain.  Regarding her left elbow she states that she has been dealing with this over the past 6-8 months.  She states that her pain is now beginning to involve her shoulder and the base of her neck.  He denies any specific injury, trauma, or events that initiated this pain.  She continues to have pain with heavy lifting and gripping of objects on the side.  She denies any numbness, weakness, or paresthesias.  No exacerbation of pain with neck range of motion.  No prior injuries to her neck, shoulder, or elbow.  Regarding her right knee she states that she has some discomfort and "popping" on the right side.  She denies any specific injury or event but states that this is relatively new.  She is very active with jogging and weightlifting.  She denies any weakness or feelings of instability.  No knee swelling or erythema.  No prior knee injuries.  No symptoms of mechanical locking or catching.  Medications/Interventions Tried: Rest, ice, heat  See HPI and/or previous note for associated ROS.  Objective: Ht 5' 3.5" (1.613 m)   Wt 131 lb (59.4 kg)   BMI 22.84 kg/m  Gen: Right-Hand Dominant. NAD, well groomed, a/o x3, normal affect.  CV: Well-perfused. Warm.  Resp: Non-labored.  Neuro: Sensation intact throughout. No gross coordination deficits.  Gait: Nonpathologic posture, unremarkable stride without signs of limp or balance issues. Elbow, Left: Patient reports TTP at the lateral elbow w/o a specific point of tenderness. Pain also extends proximally up the lateral shoulder and into the scapula/rhomboid. Inspection yields no evidence of bony deformity, effusion, erythema, ecchymosis, or rash. Active and passive ROM intact in flexion/extension/supination/pronation. Strength 5/5 throughout. No POINT TENDERNESS at the medial or lateral epicondyle. No pain with finger/wrist extension  against resistance. No pain with gripping or finger/wrist flexion against resistance. No evidence of pain or laxity at the UCL. Shoulder, Left: TTP as above. No evidence of bony deformity, asymmetry, or muscle atrophy; No tenderness over long head of biceps (bicipital groove). [facility]. Full active and passive range of motion (180 flex Elisabeth Most/60Ext /150Abd /90ER /70IR), Thumb to T12 without significant tenderness. Strength 5/5 throughout.  Mild to moderate scapular winging noted on the left compared to the right. Sensation intact. Peripheral pulses intact.  Special Tests:   - Crossarm test: NEG   - Empty can: NEG   - Hawkins: NEG   - Obrien's test: NEG   - Yergason's: NEG   - Speeds test: NEG Knee, Right: TTP noted mildly at the inferior pole of the patella bilaterally. Inspection was negative for erythema, ecchymosis, and effusion. No obvious bony abnormalities or signs of osteophyte development. Palpation yielded no asymmetric warmth; No joint line tenderness; No condyle tenderness; No patellar tenderness; +2 patellar crepitus (Right knee only). Patellar and quadriceps tendons unremarkable, and no tenderness of the pes anserine bursa. ROM normal in flexion (135 degrees) and extension (0 degrees). Normal hamstring and quadriceps strength. Neurovascularly intact bilaterally.  - Ligaments: (Solid and consistent endpoints)   - ACL (present bilaterally)   - PCL (present bilaterally)   - LCL (present bilaterally)   - MCL (present bilaterally).   - Meniscus:   - Thessaly: NEG  - Patella:   - Patellar grind/compression: NEG   - Patellar glide: Without apprehension   Assessment and plan:  Scapular dyskinesis Patient is  showing some evidence of scapular dyskinesia.  This may be the cause of her left lateral epicondylar pain as it was apparent that her discomfort did not have any significant point tenderness at the lateral epicondyle and involved the upper arm and scapula as well.  Patient  may have some nerve impingement/irritation from this dyskinesia.  We will continue to follow. -Initiate scapular stabilization exercises -Continue lateral epicondylar home exercises -Initiate gabapentin 300 mg q. Nightly -Follow-up 4 weeks  Chondromalacia, patella, right Patient is here with complaints consistent with chondromalacia patella of the right knee.  Obvious crepitus on exam.  No evidence of instability.  Patient likely developed this with high weight and deep squat exercises. -Discussed avoidance of full deep knee bends -Initiate single leg exercises -Ice, heat as needed -Rest and compress as tolerated -Encouraged patellar knee strap if this provides any help.   Meds ordered this encounter  Medications  . gabapentin (NEURONTIN) 300 MG capsule    Sig: Take 1 capsule (300 mg total) by mouth at bedtime.    Dispense:  30 capsule    Refill:  1     Kathee DeltonIan D McKeag, MD,MS [facility] 07/24/2017 1:30 PM

## 2017-08-21 ENCOUNTER — Ambulatory Visit: Payer: BC Managed Care – PPO | Admitting: Sports Medicine

## 2017-09-11 ENCOUNTER — Encounter: Payer: Self-pay | Admitting: Sports Medicine

## 2017-09-11 ENCOUNTER — Ambulatory Visit (INDEPENDENT_AMBULATORY_CARE_PROVIDER_SITE_OTHER): Payer: BC Managed Care – PPO | Admitting: Sports Medicine

## 2017-09-11 VITALS — BP 96/51 | Ht 63.5 in | Wt 132.0 lb

## 2017-09-11 DIAGNOSIS — R0781 Pleurodynia: Secondary | ICD-10-CM

## 2017-09-11 DIAGNOSIS — M546 Pain in thoracic spine: Secondary | ICD-10-CM | POA: Diagnosis not present

## 2017-09-11 MED ORDER — KETOROLAC TROMETHAMINE 60 MG/2ML IM SOLN
60.0000 mg | Freq: Once | INTRAMUSCULAR | Status: AC
  Administered 2017-09-11: 60 mg via INTRAMUSCULAR

## 2017-09-11 MED ORDER — CYCLOBENZAPRINE HCL 10 MG PO TABS: ORAL_TABLET | ORAL | 0 refills | Status: DC

## 2017-09-11 MED ORDER — METHYLPREDNISOLONE ACETATE 80 MG/ML IJ SUSP
80.0000 mg | Freq: Once | INTRAMUSCULAR | Status: AC
  Administered 2017-09-11: 80 mg via INTRAMUSCULAR

## 2017-09-11 MED ORDER — MELOXICAM 15 MG PO TABS: 15.0000 mg | ORAL_TABLET | Freq: Every day | ORAL | 0 refills | Status: DC

## 2017-09-11 NOTE — Patient Instructions (Signed)
-   You received an 80 mg Depo Medrol injection during this visit - Wear rib belt for support - Please take this medications: Toradol 60 mg daily,  Mobic 15 mg w/ food daily for 7 days, and Flexeril 10 mg (only take 1/2 tablet) at night time as needed. - Will follow up in 1 week

## 2017-09-11 NOTE — Progress Notes (Signed)
   HPI  CC: Left back pain   Patient reports that back pain started last Thursday. Pain has worsened over the past 24 hours. Described as a sharp constant pain, 10/10 on pain scale. Located in the left mid back and radiates to the left side. Pain is worse with breathing and with movement. She has tried aleve with slight improvement.   She doesn't remember a specific injury. However, she knows that pain started after doing an overhead press last Thursday. She denies any recent changes in her exercise routine. Exercise normally involves running and strength training about 5 days a week. She denies any numbness or tingling.   ROS: As per HPI  Objective: BP (!) 96/51   Ht 5' 3.5" (1.613 m)   Wt 132 lb (59.9 kg)   BMI 23.02 kg/m  Gen: NAD, well groomed, a/o x3, normal affect.  CV: Well-perfused. Warm.  Resp: Non-labored.  Neuro: Sensation intact throughout. [place] coordination deficits.   Back Exam:  Inspection: Unremarkable. B/L paraspinal spasm Motion: Flexion 45 deg, Limited extension, Limited side bending bilaterally Palpable tenderness: Along the left lower posterior rib and surrounding muscles. No tenderness along vertebral processes.  Sensory change: Gross sensation intact to all lumbar and sacral dermatomes.    Assessment and plan:  1. Rib pain on left side Pain is most consistent with L intercostal muscle strain. Rib stress fracture less likely. Will give patient an IM steroid injection and IM toradol injection today and have pt take an anti-inflammatory and muscle relaxer, along with encouraging a rib belt for support. If no improvement, will investigate further with imaging.   Plan - Pt received an 80 mg Depo Medrol injection during this visit - Encouraged to wear rib belt for support - Instructed to take the following medications:  Mobic 15 mg w/ food daily for 7 days, and Flexeril 10 mg (only take 1/2 tablet) at night time as needed. - Will follow up in 1 week    No  problem-specific Assessment & Plan notes found for this encounter.   No orders of the defined types were placed in this encounter.   Meds ordered this encounter  Medications  . meloxicam (MOBIC) 15 MG tablet    Sig: Take 1 tablet (15 mg total) by mouth daily.    Dispense:  40 tablet    Refill:  0  . cyclobenzaprine (FLEXERIL) 10 MG tablet    Sig: Take 1/2 to 1 tabs qhs prn    Dispense:  30 tablet    Refill:  0  . methylPREDNISolone acetate (DEPO-MEDROL) injection 80 mg  . ketorolac (TORADOL) injection 60 mg     Donna Gongarshree Waylen Depaolo, MD [facility], PGY-3 09/11/2017 12:34 PM  Patient seen and evaluated with the resident. I agree with the above plan of care. IM Depo-Medrol and IM Toradol injections administered today. We discussed starting a 6 day Sterapred Dosepak but we will wait on that for now. Instead, she will use meloxicam daily and Flexeril as needed for spasm. I've encouraged moist heat. She will wear her rib belt for comfort. Follow-up with me in one week for reevaluation. Consider imaging with x-rays at follow-up if symptoms persist or worsen. Patient will call with questions or concerns in the interim.

## 2017-09-16 ENCOUNTER — Ambulatory Visit (INDEPENDENT_AMBULATORY_CARE_PROVIDER_SITE_OTHER): Payer: BC Managed Care – PPO | Admitting: Sports Medicine

## 2017-09-16 ENCOUNTER — Ambulatory Visit
Admission: RE | Admit: 2017-09-16 | Discharge: 2017-09-16 | Disposition: A | Discharge status: Home / Self Care | Payer: BC Managed Care – PPO | Source: Ambulatory Visit | Attending: Sports Medicine | Admitting: Sports Medicine

## 2017-09-16 ENCOUNTER — Other Ambulatory Visit: Payer: Self-pay | Admitting: Sports Medicine

## 2017-09-16 VITALS — BP 92/60 | Ht 63.5 in | Wt 132.0 lb

## 2017-09-16 DIAGNOSIS — R0781 Pleurodynia: Secondary | ICD-10-CM

## 2017-09-17 ENCOUNTER — Encounter: Payer: Self-pay | Admitting: Sports Medicine

## 2017-09-17 NOTE — Progress Notes (Signed)
   Subjective:    Patient ID: Donna Bell, female    DOB: 1975/08/31, 42 y.o.   MRN: [id number]  HPI   Patient comes in today for follow-up on left-sided thoracic back and rib pain. Symptoms have improved by about 50%. She is certainly not pain-free yet. She is using her Mobic and Flexeril intermittently. She is using her rib belt as well. She has not returned to the gym.   Review of Systems As above    Objective:   Physical Exam  Well-developed, well-nourished. No acute distress. Awake alert and oriented 3. Vital signs reviewed. She's sitting comfortably in the exam room.  Patient has improved thoracic range of motion today compared to last week but still has pain with twisting to the right and side bending. She has minimal tenderness to palpation today. No spasm. Still has some reproducible pain with deep breathing.  X-rays of her ribs are unremarkable. Specifically, I see no evidence of fracture.      Assessment & Plan:   Improving left-sided thoracic and rib pain  X-rays are unremarkable. I think this is muscular in nature. Symptoms are improving, albeit slowly. She will continue to wean from her meloxicam and Flexeril. She can wean from her rib belt as symptoms allow as well. She needs to avoid resistance training until she is 100% pain-free with activities of daily living. I will look for her symptoms to continue to improve to the point of resolution over the next couple of weeks. I did discuss the possibility of a Sterapred Dosepak if her symptoms plateau. She will follow-up for ongoing or recalcitrant issues.

## 2017-12-23 ENCOUNTER — Other Ambulatory Visit: Payer: Self-pay | Admitting: Physician Assistant

## 2017-12-23 DIAGNOSIS — F411 Generalized anxiety disorder: Secondary | ICD-10-CM

## 2017-12-23 NOTE — Telephone Encounter (Signed)
Patient is requesting a refill of the following medications: Requested Prescriptions   Pending Prescriptions Disp Refills  . FLUoxetine (PROZAC) 10 MG capsule [Pharmacy Med Name: FLUOXETINE HCL 10 MG CAPSULE] 365 capsule 0    Sig: TAKE 1 CAPSULE BY MOUTH EVERY DAY    Date of patient request: 12/23/2017 Last office visit: 02/28/2017 Date of last refill: 02/28/2017 Last refill amount: 365 Follow up time period per chart: n/a  Please advise. Dgaddy, CMA

## 2018-01-28 ENCOUNTER — Ambulatory Visit: Payer: BC Managed Care – PPO | Admitting: Physician Assistant

## 2018-01-28 ENCOUNTER — Other Ambulatory Visit: Payer: Self-pay

## 2018-01-28 ENCOUNTER — Encounter: Payer: Self-pay | Admitting: Physician Assistant

## 2018-01-28 VITALS — BP 110/80 | HR 61 | Temp 98.0°F | Resp 16 | Ht 64.0 in | Wt 134.4 lb

## 2018-01-28 DIAGNOSIS — J329 Chronic sinusitis, unspecified: Secondary | ICD-10-CM | POA: Diagnosis not present

## 2018-01-28 MED ORDER — AZITHROMYCIN 250 MG PO TABS: ORAL_TABLET | ORAL | 0 refills | Status: DC

## 2018-01-28 NOTE — Patient Instructions (Addendum)
I am treating you today for sinusitis. Start taking Azithromycin as directed.  Consider using a Neti Pot (you can buy this at your pharmacy). If not, try the saline sinus rinse.   Take Mucinex D for the next 3-4 days. Drink plenty of water while taking this medication. Water will help thin out your mucus.  Flonase nasal spray - use this twice daily.   Come back if you are not better in 7-10 days.     Symptomatic therapies - Symptomatic management of acute rhinosinusitis (ARS) aims to relieve symptoms of nasal obstruction and runny nose as well as the systemic signs and symptoms such as fever and fatigue. When needed, we suggest over-the-counter (OTC) analgesics and antipyretics, saline irrigation, and intranasal glucocorticoids for symptomatic management in patients with ARS. Analgesics and antipyretics - OTC analgesics and antipyretics such as nonsteroidal anti-inflammatory drugs and acetaminophen can be used for pain and fever relief as needed. Saline irrigation - Mechanical irrigation with saline may reduce the need for pain medication and improve overall patient comfort, particularly in patients with frequent sinus infections. It is important that irrigants be prepared from sterile or bottled water. (See below for instructions)  Intranasal glucocorticoids -  Intranasal glucocorticoids are likely to be most beneficial for patients with underlying allergies. This allows improved sinus drainage. A higher dose of intranasal glucocorticoids had a stronger effect on symptom improvement. -Other- ?Oral decongestants - Oral decongestants may be useful when eustachian tube dysfunction is a factor for patients with AVRS. These patients may benefit from a short course (three to five days) of oral decongestants. Oral decongestants should be used with caution in patients with cardiovascular disease, hypertension, angle-closure glaucoma, or bladder neck obstruction. ?Intranasal decongestants - Intranasal  decongestants are often used as symptomatic therapies by patients. These agents, such as oxymetazoline, may provide a subjective sense of improved nasal patency. There is also concern that intranasal decongestants themselves may provoke mucosal inflammation. If used, topical decongestants should be used sparingly for no more than three consecutive days to avoid rebound congestion, addiction, and mucosal damage associated with long-term use. ?Antihistamines - Antihistamines are frequently used for symptom relief due to their drying effects; however, there are no studies investigating their efficacy for ARS. Over-drying of the mucosa may lead to further discomfort. Additionally, antihistamines are often associated with adverse effects.  ?Mucolytics - Mucolytics such as guaifenesin serve to thin secretions and may promote ease of mucus drainage and clearance.   SALINE NASAL IRRIGATION  The benefits  1. Saline (saltwater) washes the mucus and irritants from your nose.  2. The sinus passages are moisturized.  3. Studies have also shown that a nasal irrigation improves cell function (the cells that move the mucus work better).  The recipe  Use a one-quart glass jar that is thoroughly cleansed.  You may use a large medical syringe (30 cc), water pick with an irrigation tip (preferred method), squeeze bottle, or Neti pot. Do not use a baby bulb syringe. The syringe or pick should be sterilized frequently or replaced every two to three weeks to avoid contamination and infection.  Fill with water that has been distilled, previously boiled, or otherwise sterilized. Plain tap water is not recommended, because it is not necessarily sterile.  Add 1 to 1 heaping teaspoons of pickling/canning salt. Do not use table salt, because it contains a large number of additives.  Add 1 teaspoon of baking soda (pure bicarbonate).  Mix ingredients together, and store at room temperature. Discard after one  week.  You may also  make up a solution from premixed packets that are commercially prepared specifically for nasal irrigation.  The instructions  Irrigate your nose with saline one to two times per day.   If you have been told to use nasal medication, you should always use your saline solution first. The nasal medication is much more effective when sprayed onto clean nasal membranes, and the spray will reach deeper into the nose.   Pour the amount of fluid you plan to use into a clean bowl. Do not put your used syringe back into the storage container, because it contaminates your solution.   You may warm the solution slightly in the microwave, but be sure that the solution is not hot.   Bend over the sink (some people do this in the shower), and squirt the solution into each side of your nose, aiming the stream toward the back of your head, not the top of your head. The solution should flow into one nostril and out of the other, but it will not harm you if you swallow a little.   Some people experience a little burning sensation the first few times that they use buffered saline solution, but this usually goes away after they adapt to it.       IF you received an x-ray today, you will receive an invoice from [facility]. Please contact [facility] at [phone] with questions or concerns regarding your invoice.   IF you received labwork today, you will receive an invoice from [place]. Please contact [facility] at [phone] with questions or concerns regarding your invoice.   Our billing staff will not be able to assist you with questions regarding bills from these companies.  You will be contacted with the lab results as soon as they are available. The fastest way to get your results is to activate your My Chart account. Instructions are located on the last page of this paperwork. If you have not heard from [place] regarding the results in 2 weeks, please contact this office.

## 2018-01-28 NOTE — Progress Notes (Signed)
   Donna GathersGretchen L Bell  MRN: [id number] DOB: 1975-12-28  PCP: Ofilia Neaslark, Michael L, PA-C  Subjective:  Pt is a 42 year old female who presents to clinic for headache and sinus drainage x 8 days.  Endorses achiness, fatigue, sinus pressure and drainage.  She has been taking otc medication, staying well hydrated.    She has PCN allergy   Review of Systems  Constitutional: Positive for fatigue. Negative for chills, diaphoresis and fever.  HENT: Positive for congestion, rhinorrhea, sinus pressure and sinus pain. Negative for ear discharge and ear pain.   Neurological: Positive for headaches. Negative for dizziness.    Patient Active Problem List   Diagnosis Date Noted  . Scapular dyskinesis 07/24/2017  . 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 Left lateral epicondylitis 12/12/2016  . Patellofemoral pain, chronic 10/19/2014  . Anxiety     Current Outpatient Medications on File Prior to Visit  Medication Sig Dispense Refill  . ALPRAZolam (XANAX) 0.5 MG tablet TAKE ONE-HALF TO ONE TABLET BY MOUTH DAILY AS NEEDED 20 tablet 1  . FLUoxetine (PROZAC) 10 MG capsule TAKE 1 CAPSULE BY MOUTH EVERY DAY 365 capsule 0  . Multiple Vitamin (MULTIVITAMIN) tablet Take 1 tablet by mouth daily.    . cyclobenzaprine (FLEXERIL) 10 MG tablet Take 1/2 to 1 tabs qhs prn (Patient not taking: Reported on 01/28/2018) 30 tablet 0  . meloxicam (MOBIC) 15 MG tablet Take 1 tablet (15 mg total) by mouth daily. (Patient not taking: Reported on 01/28/2018) 40 tablet 0   No current facility-administered medications on file prior to visit.     Allergies  Allergen Reactions  . Amoxicillin Rash     Objective:  BP 110/80 (BP Location: Left Arm, Patient Position: Sitting, Cuff Size: Normal)   Pulse 61   Temp 98 F (36.7 C) (Oral)   Resp 16   Ht 5\' 4"  (1.626 m)   Wt 134 lb 6.4 oz (61 kg)   LMP 01/20/2018   SpO2 98%   BMI 23.07 kg/m   Physical Exam  Constitutional: She is oriented to person, place, and time.  No distress.  HENT:  Right Ear: Tympanic membrane normal.  Left Ear: Tympanic membrane normal.  Nose: Mucosal edema present. No rhinorrhea. Right sinus exhibits no maxillary sinus tenderness and no frontal sinus tenderness. Left sinus exhibits no maxillary sinus tenderness and no frontal sinus tenderness.  Mouth/Throat: Oropharynx is clear and moist and mucous membranes are normal.  Cardiovascular: Normal rate, regular rhythm and normal heart sounds.  Pulmonary/Chest: Effort normal and breath sounds normal. No respiratory distress. She has no wheezes. She has no rales.  Neurological: She is alert and oriented to person, place, and time.  Skin: Skin is warm and dry.  Psychiatric: Judgment normal.  Vitals reviewed.   Assessment and Plan :  1. Sinusitis, unspecified chronicity, unspecified location - pt endorses sinus pain and pressure x 8 days. Encouraged nasal rinse and mucinex. RTC if no improvement.  - azithromycin (ZITHROMAX) 250 MG tablet; Take 2 tabs PO x 1 dose, then 1 tab PO QD x 4 days  Dispense: 6 tablet; Refill: 0    Marco CollieWhitney Irina Okelly, PA-C  [facility] 01/28/2018 12:36 PM

## 2018-03-23 ENCOUNTER — Encounter: Payer: Self-pay | Admitting: Gastroenterology

## 2018-03-23 ENCOUNTER — Other Ambulatory Visit (INDEPENDENT_AMBULATORY_CARE_PROVIDER_SITE_OTHER): Payer: BC Managed Care – PPO

## 2018-03-23 ENCOUNTER — Ambulatory Visit (INDEPENDENT_AMBULATORY_CARE_PROVIDER_SITE_OTHER): Payer: BC Managed Care – PPO | Admitting: Gastroenterology

## 2018-03-23 VITALS — BP 100/80 | HR 68 | Ht 63.25 in | Wt 133.4 lb

## 2018-03-23 DIAGNOSIS — R110 Nausea: Secondary | ICD-10-CM

## 2018-03-23 DIAGNOSIS — R1110 Vomiting, unspecified: Secondary | ICD-10-CM

## 2018-03-23 DIAGNOSIS — R1013 Epigastric pain: Secondary | ICD-10-CM | POA: Diagnosis not present

## 2018-03-23 LAB — H. PYLORI ANTIBODY, IGG: H Pylori IgG: NEGATIVE

## 2018-03-23 MED ORDER — OMEPRAZOLE 20 MG PO CPDR: 20.0000 mg | DELAYED_RELEASE_CAPSULE | Freq: Two times a day (BID) | ORAL | 0 refills | Status: AC

## 2018-03-23 NOTE — Patient Instructions (Addendum)
  We have sent the following medications to your pharmacy for you to pick up at your convenience: Prilosec 20 mg twice daily x 4 weeks   Please stop at the lab on the 2nd floor [address] before you leave the office today.   Thank you, Dr. Barron Alvineirigliano

## 2018-03-23 NOTE — Progress Notes (Signed)
Chief Complaint: Epigastric pain   Referring Provider:     Self     HPI:    Patient is a 42 yo pleasant female presenting to the GI Clinic with  A 3 week complaint of MEG burning pain. She states the sxs started with MEG pain along with nausea, regurgiation, and waterbrash. Sxs occur independent of eating or fasting, and no alleviating or exacerbating factors. No change with position or time of day. No medication trials. Pain lasts 20-60 mins. No associated changes in bowel habits, hematochezia, melena. No change in weight and no fever, chills, or vomiting. She does state that the paiun started in conjunction with increased work stressors lately, and improved while off of work for the last few days. She does endorse some mild bloating over the last few weeks, but this is independent of the pain (*ie, occurs without pain). No prior similar sxs. No prior EGD.    Allergy to Amoxicillin   Past Medical History:  Diagnosis Date  . Anxiety   . Lymphedema of both lower extremities    onset age 42; s/p evaluation [facility]; compression stockings.     Past Surgical History:  Procedure Laterality Date  . WRIST SURGERY     Family History  Problem Relation Age of Onset  . Diabetes Father   . Mental illness Father        depression  . Cancer Maternal Grandmother   . Esophageal cancer Maternal Grandmother   . Cancer Maternal Grandfather   . Cancer Paternal Grandmother    Social History   Tobacco Use  . Smoking status: Former Games developermoker  . Smokeless tobacco: Never Used  Substance Use Topics  . Alcohol use: Yes  . Drug use: Never   Current Outpatient Medications  Medication Sig Dispense Refill  . ALPRAZolam (XANAX) 0.5 MG tablet TAKE ONE-HALF TO ONE TABLET BY MOUTH DAILY AS NEEDED 20 tablet 1  . FLUoxetine (PROZAC) 10 MG capsule TAKE 1 CAPSULE BY MOUTH EVERY DAY 365 capsule 0  . Multiple Vitamin (MULTIVITAMIN) tablet Take 1 tablet by mouth daily.     No current  facility-administered medications for this visit.    Allergies  Allergen Reactions  . Amoxicillin Rash     Review of Systems: All systems reviewed and negative except where noted in HPI.    Physical Exam:    Wt Readings from Last 3 Encounters:  03/23/18 133 lb 6.4 oz (60.5 kg)  01/28/18 134 lb 6.4 oz (61 kg)  09/16/17 132 lb (59.9 kg)    BP 100/80   Pulse 68   Ht 5' 3.25" (1.607 m)   Wt 133 lb 6.4 oz (60.5 kg)   SpO2 98%   BMI 23.44 kg/m  Constitutional:  Pleasant female in no acute distress. Psychiatric: Normal mood and affect. Behavior is normal. EENT: Pupils normal.  Conjunctivae are normal. No scleral icterus. Neck supple.  Cardiovascular: Normal rate, regular rhythm. No edema Pulmonary/chest: Effort normal and breath sounds normal. No wheezing, rales or rhonchi. Abdominal: Soft, nondistended, nontender. Bowel sounds active throughout. There are no masses palpable. No hepatomegaly. Neurological: Alert and oriented to person place and time. Skin: Skin is warm and dry. No rashes noted.   ASSESSMENT AND PLAN;   1) Abdominal pain: 42 yo female with new onset MEG pain in the setting of increased work stress. Pain not reproducible on exam. Discussed the potential etiologies for her pain, to include PUD,  H pylori, dyspepsia, and discussed potential diagnostic and treatment options at length, to include labs, EGD, and medications, and she would like to proceed with a conservative approach as below: - Start Prilosec 20 mg PO BID x4 weeks today  - H pylori Ag for test and tx method - If no change in sxs and H pylori negative, will recommend EGD to eval for mucosal/luminal etiology - Discussed stress relationship with sxs - RTC in 2-3 months, or sooner prn  2) Regurgitation: Sxs related to recent abdominal pain. Will eval for clinical improvement with trial of PPI as above as well as discussion of stress coping     Doristine LocksVito Cirigliano, DO, [facility]

## 2018-06-01 ENCOUNTER — Ambulatory Visit: Payer: BC Managed Care – PPO | Admitting: Gastroenterology

## 2018-06-04 IMAGING — CR DG RIBS W/ CHEST 3+V*L*
3 series · 3 of 3 positions shown · non-contrast
Comparison: None.

CLINICAL DATA: Left rib pain for 1 week.

EXAM:
LEFT RIBS AND CHEST - 3+ VIEW

[w chest pa]
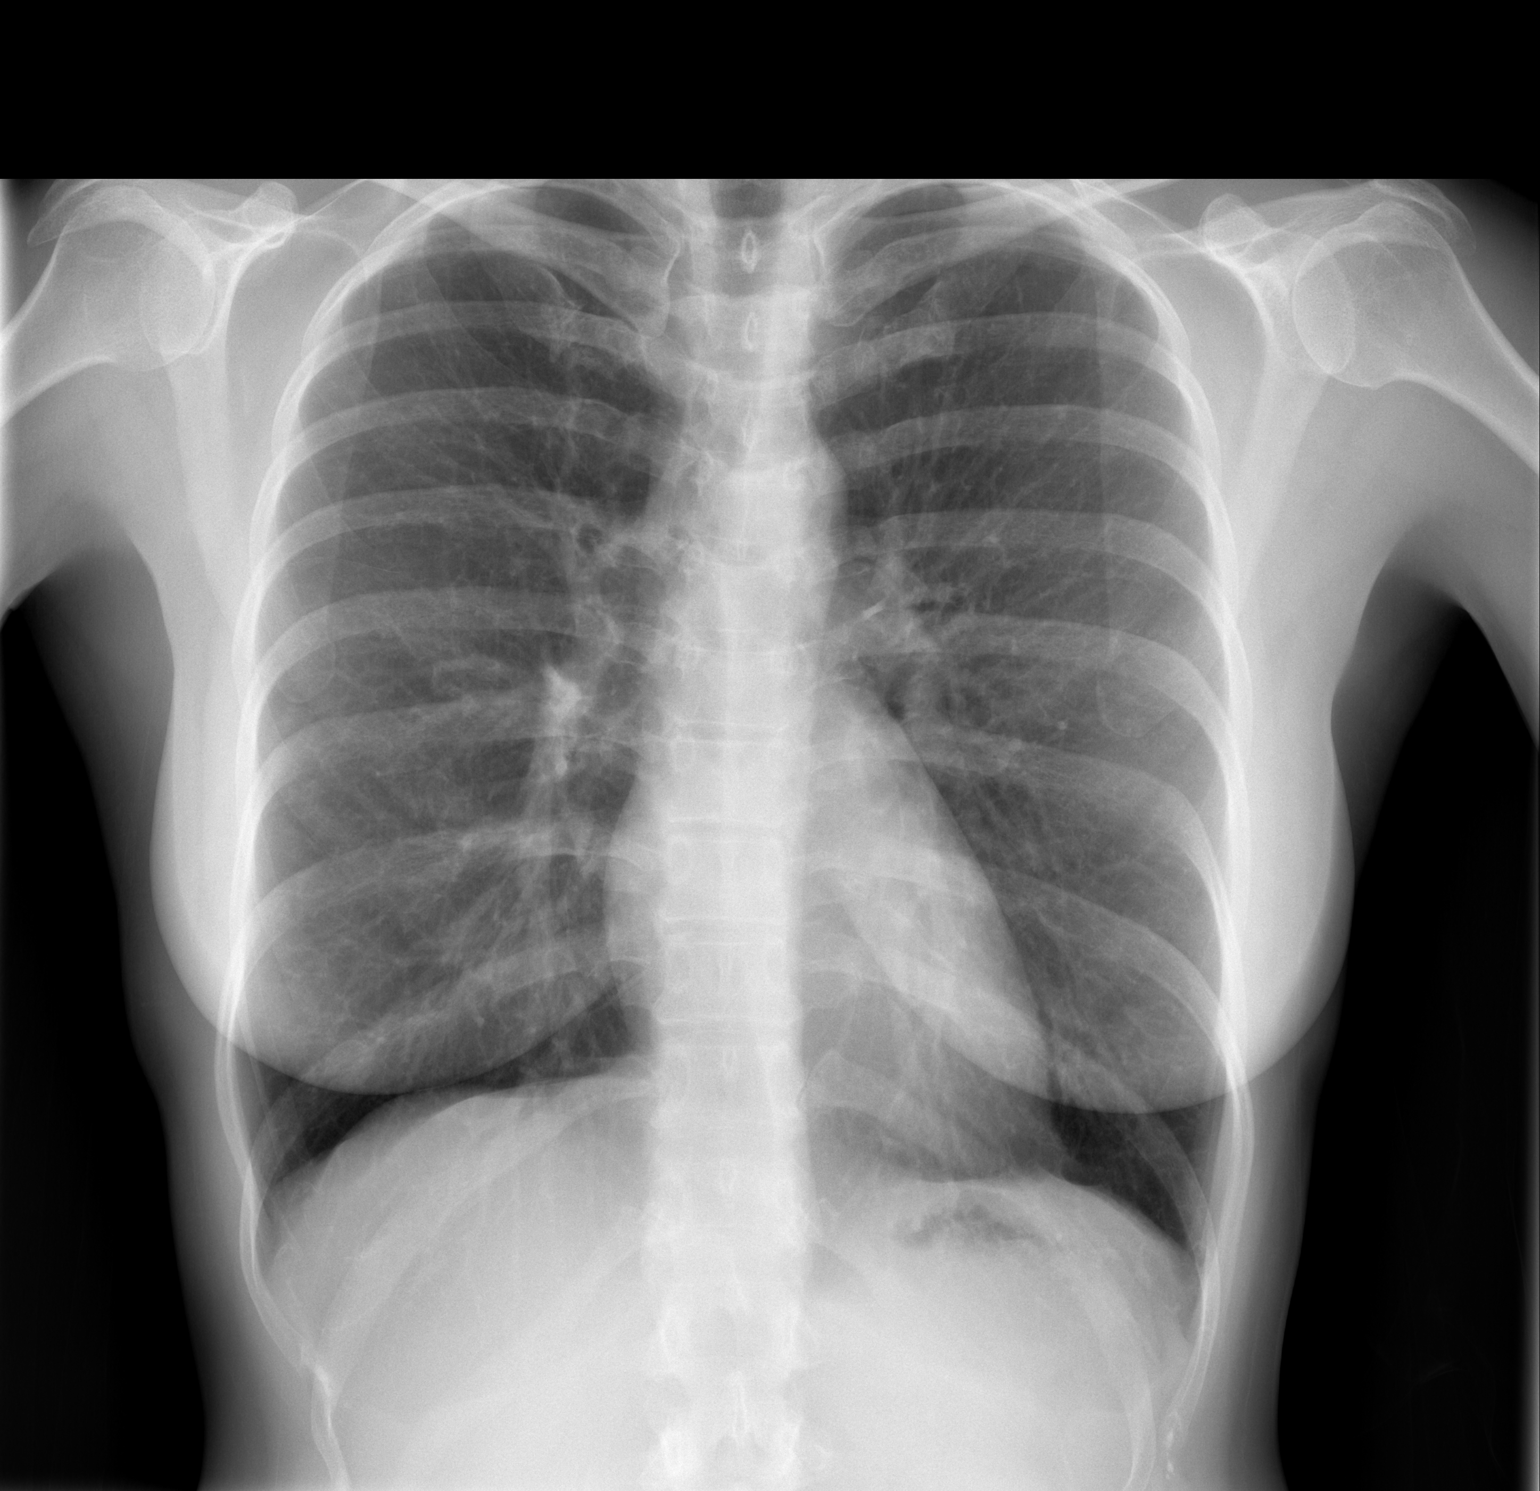

[w ribs ap/pa lower left *]
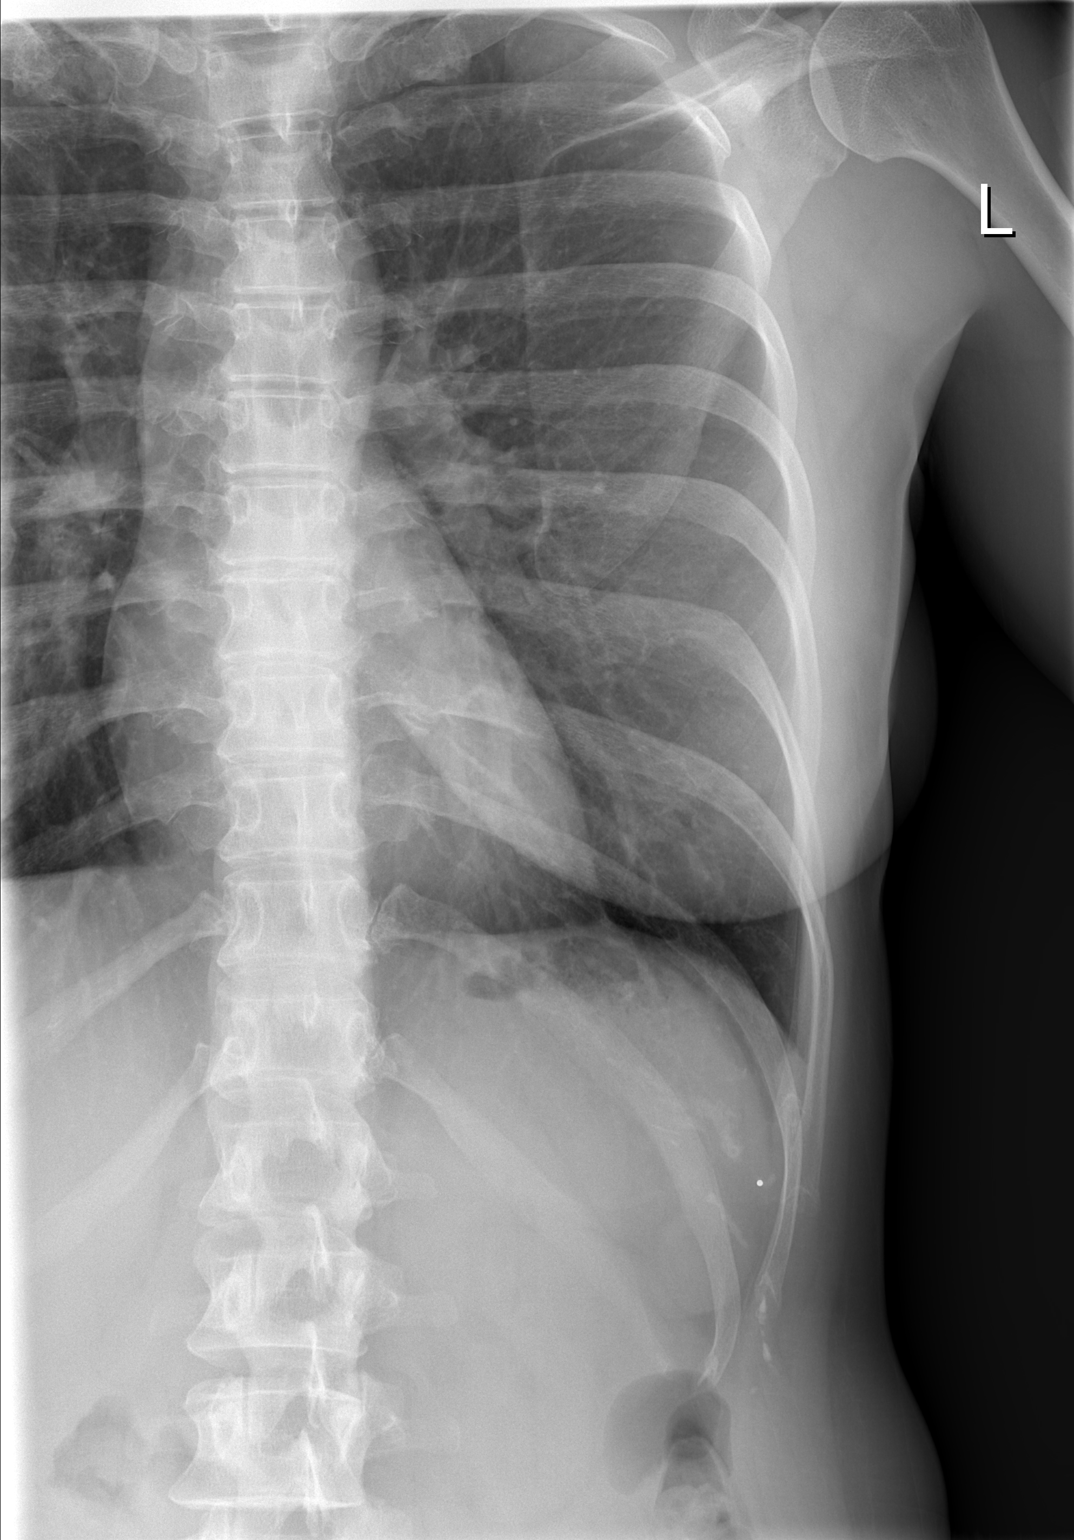

[w ribs oblique left *]
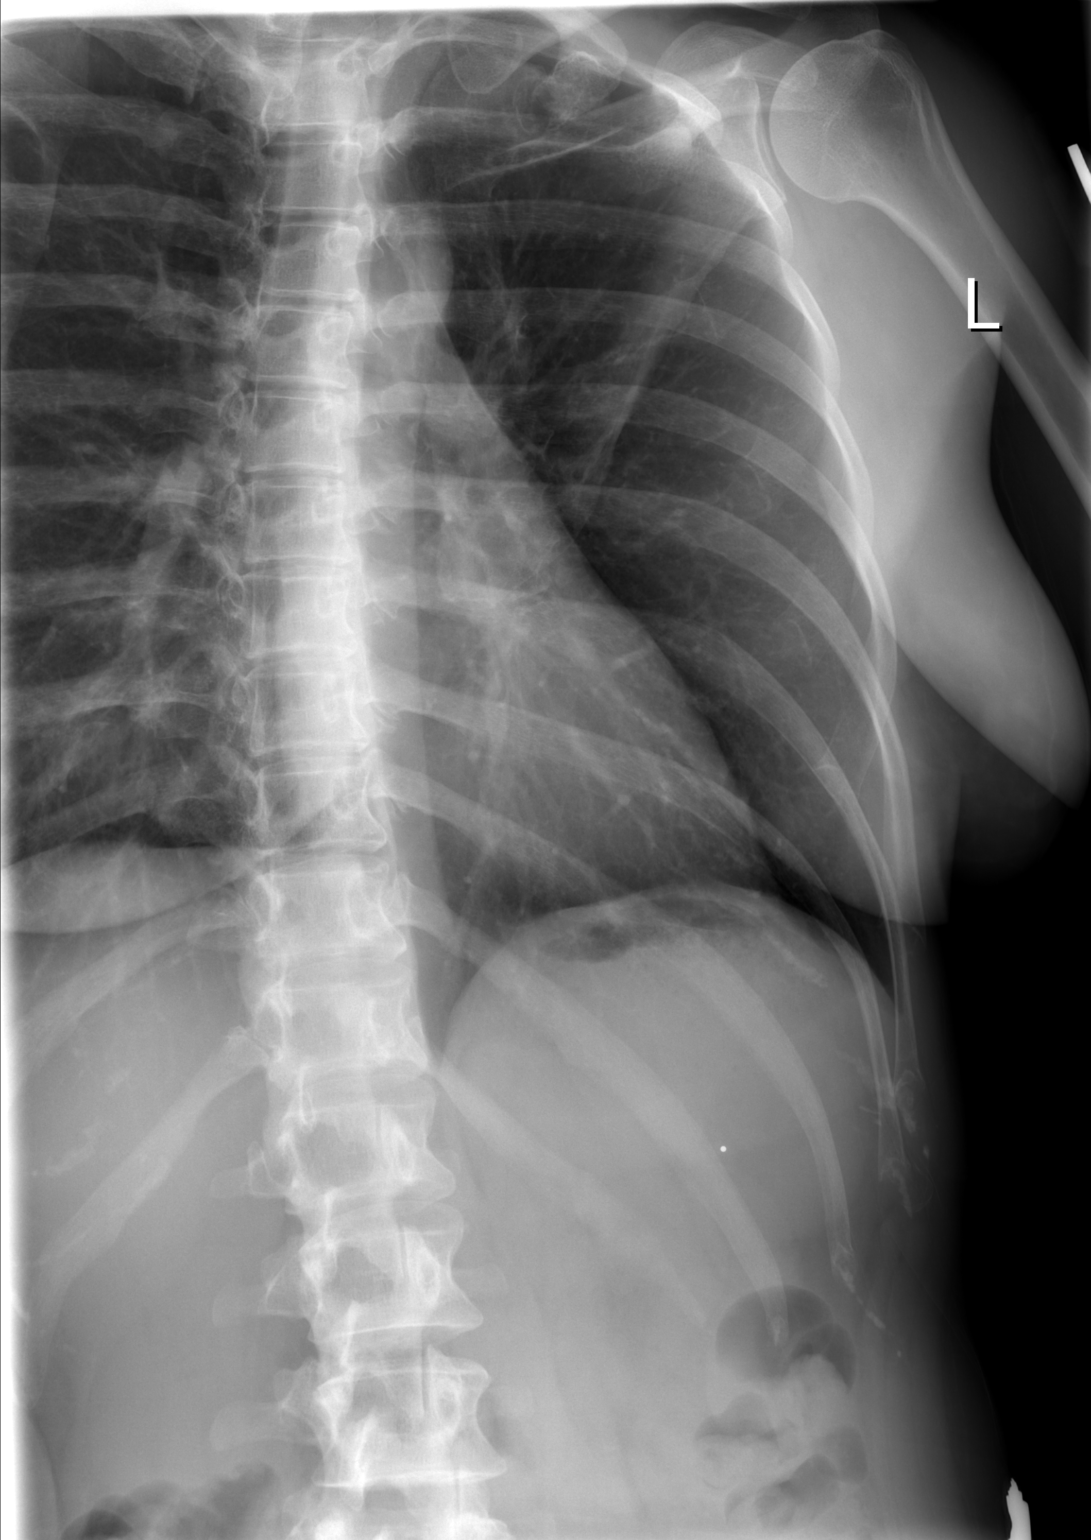

[3 of 3 positions shown; findings below may reference images not displayed]

FINDINGS: No fracture or other bone lesions are seen involving the ribs. There
is no evidence of pneumothorax or pleural effusion. Both lungs are
clear. Heart size and mediastinal contours are within normal limits.
IMPRESSION: Negative.

## 2019-01-26 ENCOUNTER — Other Ambulatory Visit: Payer: Self-pay | Admitting: Physician Assistant

## 2019-01-26 DIAGNOSIS — F411 Generalized anxiety disorder: Secondary | ICD-10-CM

## 2019-01-26 NOTE — Telephone Encounter (Signed)
Patient is requesting a refill of the following medications: Requested Prescriptions   Pending Prescriptions Disp Refills  . FLUoxetine (PROZAC) 10 MG capsule [Pharmacy Med Name: FLUOXETINE HCL 10 MG CAPSULE] 90 capsule 4    Sig: TAKE 1 CAPSULE BY MOUTH EVERY DAY    Date of patient request:01/26/2019 Last office visit: 01/28/18 w/ mcvey and 02/28/17 w/ clark for GAD Date of last refill: 12/23/17 Last refill amount: # 365 Follow up time period per chart:n/a

## 2020-01-27 ENCOUNTER — Ambulatory Visit: Payer: BC Managed Care – PPO | Admitting: Family Medicine

## 2020-01-27 ENCOUNTER — Ambulatory Visit: Payer: Self-pay

## 2020-01-27 ENCOUNTER — Other Ambulatory Visit: Payer: Self-pay

## 2020-01-27 ENCOUNTER — Encounter: Payer: Self-pay | Admitting: Family Medicine

## 2020-01-27 DIAGNOSIS — M7122 Synovial cyst of popliteal space [Baker], left knee: Secondary | ICD-10-CM

## 2020-01-27 DIAGNOSIS — M7121 Synovial cyst of popliteal space [Baker], right knee: Secondary | ICD-10-CM

## 2020-01-27 DIAGNOSIS — M25561 Pain in right knee: Secondary | ICD-10-CM

## 2020-01-27 DIAGNOSIS — M25562 Pain in left knee: Secondary | ICD-10-CM | POA: Diagnosis not present

## 2020-01-27 NOTE — Progress Notes (Signed)
Office Visit Note   Patient: Donna Bell           Date of Birth: [birth year]           MRN: [id number] Visit Date: 01/27/2020 Requested by: No referring provider defined for this encounter. PCP: No primary care provider on file.  Subjective: Chief Complaint  Patient presents with  . Left Knee - Pain  . Right Knee - Pain    HPI: She is here with bilateral knee pain.  Longstanding problems with both knees, pain in the posterior aspect.  At some point she was told she had Baker's cyst.  She does not remember how the diagnosis was made.  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  She does not notice any locking, catching, or giving way.  After doing certain activities, she sometimes feels tightness in the back of her knees, the right 1 seems to be more problematic than the left.  Recently after doing a long bike ride, her symptoms were exacerbated.  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  It does not affect her legs.  She occasionally uses compression stockings.  She underwent extensive work-up for this.  She works on her feet much of the time.               ROS: She is otherwise been in good health.  All other systems were reviewed and are negative.  Objective: Vital Signs: There were no vitals taken for this visit.  Physical Exam:  General:  Alert and oriented, in no acute distress. Pulm:  Breathing unlabored. Psy:  Normal mood, congruent affect. Skin: No rash. Knees: 2+ patellofemoral crepitus on the right, 1+ on the left.  No significant pain with patella compression today.  No joint effusions on either side.  Ligaments feel stable.  Right knee has tenderness over the medial joint line and a palpable click with pain on McMurray's.  There is fullness on the popliteal fossa posterior medially in the right knee, mild on the left.  No joint line tenderness on the left.  McMurray's is negative on the  left.  Imaging: US Guided Needle Placement  Result Date: 01/27/2020 Limited diagnostic ultrasound both knees: She has a moderate sized popliteal cyst posterior medially on the right, and a small 1 in the same area on the left.  No other abnormality seen by ultrasound posteriorly.  XR KNEE 3 VIEW LEFT  Result Date: 01/27/2020 X-rays left knee reveal early patellofemoral spurring, mild to moderate medial compartment narrowing.  No sign of loose body or stress fracture.  XR KNEE 3 VIEW RIGHT  Result Date: 01/27/2020 X-rays right knee reveal tricompartmental moderate degenerative change, most pronounced in the medial compartment.  No sign of loose body.  No sign of stress fracture.   Assessment & Plan: 1.  Chronic bilateral knee pain, right greater than left, with bilateral patella chondromalacia, popliteal cyst and DJD.  Question medial meniscus tear on the right. -We will proceed with MRI scan of both knees.  Depending on the results, possible surgical consult.  Could contemplate a one-time intra-articular cortisone injection of the knees, aspiration and injection of popliteal cyst. -She will minimize squatting, lunging, kneeling.  She will minimize high impact exercise activities. -Try glucosamine sulfate.      Procedures: No procedures performed  No notes on file     PMFS History: Patient Active Problem List   Diagnosis  Date Noted  . Scapular dyskinesis 07/24/2017  . 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 Left lateral epicondylitis 12/12/2016  . Patellofemoral pain, chronic 10/19/2014  . Anxiety    Past Medical History:  Diagnosis Date  . Anxiety   . Lymphedema of both lower extremities    onset age 44; s/p evaluation [facility]; compression stockings.    Family History  Problem Relation Age of Onset  . Diabetes Father   . Mental illness Father        depression  . Cancer Maternal Grandmother   . Esophageal cancer Maternal Grandmother   . Cancer Maternal  Grandfather   . Cancer Paternal Grandmother     Past Surgical History:  Procedure Laterality Date  . WRIST SURGERY     Social History   Occupational History  . Not on file  Tobacco Use  . Smoking status: Former Research scientist (life sciences)  . Smokeless tobacco: Never Used  Substance and Sexual Activity  . Alcohol use: Yes  . Drug use: Never  . Sexual activity: Not on file

## 2020-03-01 ENCOUNTER — Other Ambulatory Visit: Payer: BC Managed Care – PPO

## 2020-04-03 ENCOUNTER — Telehealth: Payer: Self-pay | Admitting: Family Medicine

## 2020-04-03 NOTE — Telephone Encounter (Signed)
See request for xrays below. Thanks

## 2020-04-03 NOTE — Telephone Encounter (Signed)
Patient called. Wants to get copy of xrays. Will sign release when comes to pick up. Call when ready. [phone]

## 2020-04-03 NOTE — Telephone Encounter (Signed)
Patient aware CD is ready for pickup at front desk.  

## 2020-04-12 ENCOUNTER — Telehealth: Payer: Self-pay | Admitting: General Practice

## 2020-04-12 NOTE — Telephone Encounter (Signed)
Called pt back. Pt informed me she wanted a refill on her Prozac. I informed pt that she would have to establish care with one of our providers. Pt hasn't been to our practice in 3 years. Pt informed me she was think of going to another practice. I informed the pt to give [place] a call back once she has made a decision.

## 2020-04-12 NOTE — Telephone Encounter (Signed)
Pt called wanting a med refill. Let pt know she will need to establish care with a provider here. Pt  is wanting a nurse to call her about it. Let pt know they will need to have a establishing care appt for refill aswell. Pt stated she wanted a nurse to call her still. Please advise.

## 2021-04-03 ENCOUNTER — Ambulatory Visit: Payer: BC Managed Care – PPO | Admitting: Family Medicine
